# Patient Record
Sex: Female | Born: 2000 | Hispanic: Yes | Marital: Single | Smoking: Never smoker
Health system: Southern US, Community
[De-identification: ages and names within clinical notes are randomized; demographics above are authoritative.]

## PROBLEM LIST (undated history)

## (undated) ENCOUNTER — Inpatient Hospital Stay: Payer: Self-pay

## (undated) DIAGNOSIS — D649 Anemia, unspecified: Secondary | ICD-10-CM

## (undated) HISTORY — PX: NO PAST SURGERIES: SHX2092

---
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
## 2019-04-23 ENCOUNTER — Other Ambulatory Visit: Payer: Self-pay

## 2019-04-23 ENCOUNTER — Encounter: Payer: Self-pay | Admitting: Emergency Medicine

## 2019-04-23 DIAGNOSIS — R109 Unspecified abdominal pain: Secondary | ICD-10-CM | POA: Diagnosis not present

## 2019-04-23 DIAGNOSIS — Z5321 Procedure and treatment not carried out due to patient leaving prior to being seen by health care provider: Secondary | ICD-10-CM | POA: Insufficient documentation

## 2019-04-23 LAB — URINALYSIS, COMPLETE (UACMP) WITH MICROSCOPIC
Bacteria, UA: NONE SEEN
Bilirubin Urine: NEGATIVE
Glucose, UA: NEGATIVE mg/dL
Hgb urine dipstick: NEGATIVE
Ketones, ur: NEGATIVE mg/dL
Nitrite: NEGATIVE
Protein, ur: NEGATIVE mg/dL
Specific Gravity, Urine: 1.005 (ref 1.005–1.030)
pH: 8 (ref 5.0–8.0)

## 2019-04-23 LAB — COMPREHENSIVE METABOLIC PANEL
ALT: 15 U/L (ref 0–44)
AST: 14 U/L — ABNORMAL LOW (ref 15–41)
Albumin: 4.6 g/dL (ref 3.5–5.0)
Alkaline Phosphatase: 51 U/L (ref 38–126)
Anion gap: 8 (ref 5–15)
BUN: 7 mg/dL (ref 6–20)
CO2: 24 mmol/L (ref 22–32)
Calcium: 9.4 mg/dL (ref 8.9–10.3)
Chloride: 107 mmol/L (ref 98–111)
Creatinine, Ser: 0.63 mg/dL (ref 0.44–1.00)
GFR calc Af Amer: >60 mL/min (ref >60)
GFR calc non Af Amer: >60 mL/min (ref >60)
Glucose, Bld: 89 mg/dL (ref 70–99)
Potassium: 3.9 mmol/L (ref 3.5–5.1)
Sodium: 139 mmol/L (ref 135–145)
Total Bilirubin: 0.6 mg/dL (ref 0.3–1.2)
Total Protein: 8 g/dL (ref 6.5–8.1)

## 2019-04-23 LAB — CBC WITH DIFFERENTIAL/PLATELET
Abs Immature Granulocytes: 0.03 10*3/uL (ref 0.00–0.07)
Basophils Absolute: 0 10*3/uL (ref 0.0–0.1)
Basophils Relative: 0 %
Eosinophils Absolute: 0.1 10*3/uL (ref 0.0–0.5)
Eosinophils Relative: 1 %
HCT: 40.9 % (ref 36.0–46.0)
Hemoglobin: 13.2 g/dL (ref 12.0–15.0)
Immature Granulocytes: 0 %
Lymphocytes Relative: 39 %
Lymphs Abs: 3.4 10*3/uL (ref 0.7–4.0)
MCH: 28 pg (ref 26.0–34.0)
MCHC: 32.3 g/dL (ref 30.0–36.0)
MCV: 86.8 fL (ref 80.0–100.0)
Monocytes Absolute: 0.5 10*3/uL (ref 0.1–1.0)
Monocytes Relative: 6 %
Neutro Abs: 4.7 10*3/uL (ref 1.7–7.7)
Neutrophils Relative %: 54 %
Platelets: 243 10*3/uL (ref 150–400)
RBC: 4.71 MIL/uL (ref 3.87–5.11)
RDW: 13.6 % (ref 11.5–15.5)
WBC: 8.7 10*3/uL (ref 4.0–10.5)
nRBC: 0 % (ref 0.0–0.2)

## 2019-04-23 LAB — POCT PREGNANCY, URINE: Preg Test, Ur: NEGATIVE

## 2019-04-23 LAB — LIPASE, BLOOD: Lipase: 34 U/L (ref 11–51)

## 2019-04-23 NOTE — ED Triage Notes (Signed)
Patient ambulatory to triage with steady gait, without difficulty or distress noted, mask in place; pt reports lower abd pain x hr accomp by nausea; st seen for same 40mos ago and dx with ovarian cyst but did not f/u with anyone

## 2019-04-24 ENCOUNTER — Emergency Department
Admission: EM | Admit: 2019-04-24 | Discharge: 2019-04-24 | Disposition: A | Discharge status: Home / Self Care | Payer: Medicaid Other | Attending: Emergency Medicine | Admitting: Emergency Medicine

## 2019-04-26 LAB — GC/CHLAMYDIA PROBE AMP
Chlamydia trachomatis, NAA: NEGATIVE
Neisseria Gonorrhoeae by PCR: NEGATIVE

## 2019-05-27 ENCOUNTER — Encounter: Payer: Self-pay | Admitting: *Deleted

## 2019-05-27 ENCOUNTER — Emergency Department: Payer: Medicaid Other

## 2019-05-27 ENCOUNTER — Emergency Department
Admission: EM | Admit: 2019-05-27 | Discharge: 2019-05-27 | Disposition: A | Discharge status: Home / Self Care | Payer: Medicaid Other | Attending: Emergency Medicine | Admitting: Emergency Medicine

## 2019-05-27 ENCOUNTER — Other Ambulatory Visit: Payer: Self-pay

## 2019-05-27 DIAGNOSIS — R1032 Left lower quadrant pain: Secondary | ICD-10-CM

## 2019-05-27 DIAGNOSIS — B9689 Other specified bacterial agents as the cause of diseases classified elsewhere: Secondary | ICD-10-CM | POA: Insufficient documentation

## 2019-05-27 DIAGNOSIS — R110 Nausea: Secondary | ICD-10-CM | POA: Diagnosis not present

## 2019-05-27 DIAGNOSIS — N760 Acute vaginitis: Secondary | ICD-10-CM | POA: Insufficient documentation

## 2019-05-27 LAB — COMPREHENSIVE METABOLIC PANEL
ALT: 17 U/L (ref 0–44)
AST: 16 U/L (ref 15–41)
Albumin: 4.3 g/dL (ref 3.5–5.0)
Alkaline Phosphatase: 51 U/L (ref 38–126)
Anion gap: 9 (ref 5–15)
BUN: 10 mg/dL (ref 6–20)
CO2: 23 mmol/L (ref 22–32)
Calcium: 9.1 mg/dL (ref 8.9–10.3)
Chloride: 108 mmol/L (ref 98–111)
Creatinine, Ser: 0.49 mg/dL (ref 0.44–1.00)
GFR calc Af Amer: >60 mL/min (ref >60)
GFR calc non Af Amer: >60 mL/min (ref >60)
Glucose, Bld: 95 mg/dL (ref 70–99)
Potassium: 4.1 mmol/L (ref 3.5–5.1)
Sodium: 140 mmol/L (ref 135–145)
Total Bilirubin: 0.5 mg/dL (ref 0.3–1.2)
Total Protein: 7.2 g/dL (ref 6.5–8.1)

## 2019-05-27 LAB — URINALYSIS, COMPLETE (UACMP) WITH MICROSCOPIC
Bilirubin Urine: NEGATIVE
Glucose, UA: NEGATIVE mg/dL
Hgb urine dipstick: NEGATIVE
Ketones, ur: NEGATIVE mg/dL
Leukocytes,Ua: NEGATIVE
Nitrite: NEGATIVE
Protein, ur: NEGATIVE mg/dL
Specific Gravity, Urine: 1.017 (ref 1.005–1.030)
pH: 7 (ref 5.0–8.0)

## 2019-05-27 LAB — CBC
HCT: 40.2 % (ref 36.0–46.0)
Hemoglobin: 12.7 g/dL (ref 12.0–15.0)
MCH: 28 pg (ref 26.0–34.0)
MCHC: 31.6 g/dL (ref 30.0–36.0)
MCV: 88.7 fL (ref 80.0–100.0)
Platelets: 226 10*3/uL (ref 150–400)
RBC: 4.53 MIL/uL (ref 3.87–5.11)
RDW: 13.4 % (ref 11.5–15.5)
WBC: 8.2 10*3/uL (ref 4.0–10.5)
nRBC: 0 % (ref 0.0–0.2)

## 2019-05-27 LAB — POCT PREGNANCY, URINE: Preg Test, Ur: NEGATIVE

## 2019-05-27 LAB — WET PREP, GENITAL
Sperm: NONE SEEN
Trich, Wet Prep: NONE SEEN
Yeast Wet Prep HPF POC: NONE SEEN

## 2019-05-27 LAB — LIPASE, BLOOD: Lipase: 26 U/L (ref 11–51)

## 2019-05-27 MED ORDER — METRONIDAZOLE 500 MG PO TABS: 500.0000 mg | ORAL_TABLET | Freq: Two times a day (BID) | ORAL | 0 refills | Status: AC

## 2019-05-27 NOTE — ED Provider Notes (Signed)
[facility] Emergency Department Provider Note  ____________________________________________   First MD Initiated Contact with Patient 05/27/19 1732     (approximate)  I have reviewed the triage vital signs and the nursing notes.   HISTORY  Chief Complaint Abdominal Pain    HPI Susan Hoover is a 19 y.o. female with prior history of concern for ruptured hemorrhagic cyst who now comes in with left lower quadrant pain.  Patient states that she is been having pain in the left lower quadrant for the past 5 days.  The pain is intermittent, moderate, associate with some nausea.  Last episode was around 2:30 PM.  Currently her pain is very minimal to none.  Has had 1 prior pregnancy. No fevers.           Past Medical History:  Diagnosis Date  . Anemia     Patient Active Problem List   Diagnosis Date Noted  . Post-term pregnancy, 40-42 weeks of gestation 12/29/2017  . Pyelonephritis affecting pregnancy in second trimester 08/09/2017  . Abdominal pain 08/07/2017    History reviewed. No pertinent surgical history.  Prior to Admission medications   Medication Sig Start Date End Date Taking? Authorizing Provider  pantoprazole (PROTONIX) 40 MG tablet Take 1 tablet (40 mg total) by mouth daily. 04/28/18 06/27/18  Harvest Dark, MD    Allergies Patient has no known allergies.  No family history on file.  Social History Social History   Tobacco Use  . Smoking status: Never Smoker  . Smokeless tobacco: Never Used  Substance Use Topics  . Alcohol use: No  . Drug use: No      Review of Systems Constitutional: No fever/chills Eyes: No visual changes. ENT: No sore throat. Cardiovascular: Denies chest pain. Respiratory: Denies shortness of breath. Gastrointestinal: Positive abdominal pain with nausea, no vomiting.  No diarrhea.  No constipation. Genitourinary: Negative for dysuria. Musculoskeletal: Negative for back  pain. Skin: Negative for rash. Neurological: Negative for headaches, focal weakness or numbness. All other ROS negative ____________________________________________   PHYSICAL EXAM:  VITAL SIGNS: ED Triage Vitals  Enc Vitals Group     BP 05/27/19 1644 111/67     Pulse Rate 05/27/19 1644 66     Resp 05/27/19 1644 18     Temp 05/27/19 1644 98.2 F (36.8 C)     Temp Source 05/27/19 1644 Oral     SpO2 05/27/19 1644 100 %     Weight 05/27/19 1645 126 lb (57.2 kg)     Height 05/27/19 1645 5\' 2"  (1.575 m)     Head Circumference --      Peak Flow --      Pain Score 05/27/19 1647 9     Pain Loc --      Pain Edu? --      Excl. in [place]? --     Constitutional: Alert and oriented. Well appearing and in no acute distress. Eyes: Conjunctivae are normal. EOMI. Head: Atraumatic. Nose: No congestion/rhinnorhea. Mouth/Throat: Mucous membranes are moist.   Neck: No stridor. Trachea Midline. FROM Cardiovascular: Normal rate, regular rhythm. Grossly normal heart sounds.  Good peripheral circulation. Respiratory: Normal respiratory effort.  No retractions. Lungs CTAB. Gastrointestinal: Soft and nontender. No distention. No abdominal bruits.  Musculoskeletal: No lower extremity tenderness nor edema.  No joint effusions. Neurologic:  Normal speech and language. No gross focal neurologic deficits are appreciated.  Skin:  Skin is warm, dry and intact. No rash noted. Psychiatric: Mood and affect are  normal. Speech and behavior are normal. GU: No cervical motion tenderness.  No adnexal tenderness, some mild amount of discharge  ____________________________________________   LABS (all labs ordered are listed, but only abnormal results are displayed)  Labs Reviewed  WET PREP, GENITAL - Abnormal; Notable for the following components:      Result Value   Clue Cells Wet Prep HPF POC PRESENT (*)    WBC, Wet Prep HPF POC MODERATE (*)    All other components within normal limits  URINALYSIS, COMPLETE  (UACMP) WITH MICROSCOPIC - Abnormal; Notable for the following components:   Color, Urine YELLOW (*)    APPearance TURBID (*)    Bacteria, UA RARE (*)    All other components within normal limits  GC/CHLAMYDIA PROBE AMP  LIPASE, BLOOD  COMPREHENSIVE METABOLIC PANEL  CBC  POC URINE PREG, ED  POCT PREGNANCY, URINE   ____________________________________________ RADIOLOGY   Official radiology report(s): US PELVIC COMPLETE W TRANSVAGINAL AND TORSION R/O  Result Date: 05/27/2019 CLINICAL DATA:  Left lower quadrant pain for 1 month EXAM: TRANSABDOMINAL AND TRANSVAGINAL ULTRASOUND OF PELVIS DOPPLER ULTRASOUND OF OVARIES TECHNIQUE: Both transabdominal and transvaginal ultrasound examinations of the pelvis were performed. Transabdominal technique was performed for global imaging of the pelvis including uterus, ovaries, adnexal regions, and pelvic cul-de-sac. It was necessary to proceed with endovaginal exam following the transabdominal exam to visualize the ovaries. Color and duplex Doppler ultrasound was utilized to evaluate blood flow to the ovaries. COMPARISON:  Prior CT and ultrasound from 05/03/2018 FINDINGS: Uterus Measurements: 7.6 x 3.4 x 4.8 cm = volume: 65 mL. No fibroids or other mass visualized. Endometrium Thickness: 7.4 mm.  No focal abnormality visualized. Right ovary Measurements: 6.6 x 3.9 x 5.6 cm. = volume: 76 mL. There is a diffusely hypoechoic lesion within the right ovary with internal echoes measuring 5.6 x 3.8 x 4.8 cm. This has a similar appearance to that noted on prior ultrasound and CT consistent with a hemorrhagic cyst. Left ovary Measurements: 2.7 x 2.0 x 1.8 cm = volume: 5.2 mL. Normal appearance/no adnexal mass. Pulsed Doppler evaluation of both ovaries demonstrates normal low-resistance arterial and venous waveforms. Other findings Mild free fluid is noted within the pelvis. IMPRESSION: Persistent appearing lesion in the right ovary. Again hemorrhagic cyst is a primary  consideration. Possibility of an endometrioma deserves consideration as well given the persistent appearance. Mild free fluid is noted. No other focal abnormality is seen. Electronically Signed   By: Alcide Clever M.D.   On: 05/27/2019 19:40    ____________________________________________   PROCEDURES  Procedure(s) performed (including Critical Care):  Procedures   ____________________________________________   INITIAL IMPRESSION / ASSESSMENT AND PLAN / ED COURSE  Susan Hoover was evaluated in Emergency Department on 05/27/2019 for the symptoms described in the history of present illness. She was evaluated in the context of the global COVID-19 pandemic, which necessitated consideration that the patient might be at risk for infection with the SARS-CoV-2 virus that causes COVID-19. Institutional protocols and algorithms that pertain to the evaluation of patients at risk for COVID-19 are in a state of rapid change based on information released by regulatory bodies including the CDC and federal and state organizations. These policies and algorithms were followed during the patient's care in the ED.    Patient is a well-appearing 19 year old who comes in with left lower quadrant pain.  Given her history of hemorrhagic cyst on the right she want to make sure that there was no issues on  the left side.  Will get transvaginal ultrasound to evaluate for torsion, cyst.  She denies any risk factors for sexually transmitted diseases.  Will do pelvic exam to evaluate for PID.  Will get urine to evaluate for UTI and pregnancy test.  Preg test was negative.  UA negative for UTI.  Hemoglobin is stable.  Patient's ultrasound shows similar lesion on the right ovary that is concerning for hemorrhagic versus endometrioma.  When she was seen a year ago she had a moderate amount of free fluid but now she only has very mild free fluid.  Given this lesion is similar in size and her pain is on the left  side I do not think that this is the cause of her pain.  The left ovary looked completely normal.  We discussed the chance that she could be having intermittent torsion on the left ovary which seems unlikely given there is no evidence of cyst.  Given patient is completely asymptomatic at this time and she is very well-appearing with normal vital signs and normal labs she feels comfortable going home and will return to the ER if she develops symptoms that are worsening or any other concerns.  Otherwise she will follow-up with her OB/GYN at her [facility].   Pelvic exam did have some mild amount of discharge.  We discussed prophylactic treatment for STDs but given she is low risk she will wait for it to come back.  She had no adnexal tenderness or cervical motion tenderness to suggest PID.  Her wet prep was positive for BV.  We will start the patient on Flagyl.  Patient feels comfortable with discharge home and will follow up with her OB         ____________________________________________   FINAL CLINICAL IMPRESSION(S) / ED DIAGNOSES   Final diagnoses:  LLQ pain  Bacterial vaginosis      MEDICATIONS GIVEN DURING THIS VISIT:  Medications - No data to display   ED Discharge Orders         Ordered    metroNIDAZOLE (FLAGYL) 500 MG tablet  2 times daily     05/27/19 2053           Note:  This document was prepared using Dragon voice recognition software and may include unintentional dictation errors.   Concha Se, MD 05/27/19 2053

## 2019-05-27 NOTE — ED Triage Notes (Signed)
Pt ambulatory to triage.  Pt has left lower abd pain.  Pt also has dysuria.  Pt also has nausea.  Pt alert  Speech clear.  hx ovarian cyst.

## 2019-05-27 NOTE — ED Notes (Signed)
See triage note  Presents with some left sided abd pain  States she was seen last year for same   Was told that she had an ovarian cyst  States then pain changes in intensity

## 2019-05-27 NOTE — ED Triage Notes (Signed)
FIRST NURSE NOTE- here for pain r/t known ovarian cyst. NAD

## 2019-05-27 NOTE — Discharge Instructions (Signed)
You should follow-up with your OB doctor for your persistent lesion on the right ovary.  There did not seem to be any issues on the left ovary.  You were positive for bacterial vaginosis so we are starting you on antibiotic.  Do not drink alcohol with this.  Return to the ER for worsening pain, fevers or any other concerns    IMPRESSION: Persistent appearing lesion in the right ovary. Again hemorrhagic cyst is a primary consideration. Possibility of an endometrioma deserves consideration as well given the persistent appearance. Mild free fluid is noted. No other focal abnormality is seen.

## 2019-05-27 NOTE — ED Notes (Signed)
poct pregnancy Negative 

## 2019-05-30 LAB — GC/CHLAMYDIA PROBE AMP
Chlamydia trachomatis, NAA: NEGATIVE
Neisseria Gonorrhoeae by PCR: NEGATIVE

## 2019-11-13 ENCOUNTER — Emergency Department (HOSPITAL_COMMUNITY)
Admission: EM | Admit: 2019-11-13 | Discharge: 2019-11-13 | Disposition: A | Discharge status: Home / Self Care | Payer: Medicaid Other | Attending: Emergency Medicine | Admitting: Emergency Medicine

## 2019-11-13 ENCOUNTER — Other Ambulatory Visit: Payer: Self-pay

## 2019-11-13 ENCOUNTER — Encounter (HOSPITAL_COMMUNITY): Payer: Self-pay | Admitting: Emergency Medicine

## 2019-11-13 ENCOUNTER — Inpatient Hospital Stay (EMERGENCY_DEPARTMENT_HOSPITAL)
Admission: AD | Admit: 2019-11-13 | Discharge: 2019-11-13 | Disposition: A | Discharge status: Home / Self Care | Payer: Medicaid Other | Source: Home / Self Care | Attending: Obstetrics and Gynecology | Admitting: Obstetrics and Gynecology

## 2019-11-13 DIAGNOSIS — M545 Low back pain: Secondary | ICD-10-CM | POA: Diagnosis not present

## 2019-11-13 DIAGNOSIS — N939 Abnormal uterine and vaginal bleeding, unspecified: Secondary | ICD-10-CM | POA: Insufficient documentation

## 2019-11-13 DIAGNOSIS — Z3202 Encounter for pregnancy test, result negative: Secondary | ICD-10-CM | POA: Diagnosis not present

## 2019-11-13 DIAGNOSIS — Z5321 Procedure and treatment not carried out due to patient leaving prior to being seen by health care provider: Secondary | ICD-10-CM | POA: Insufficient documentation

## 2019-11-13 LAB — BASIC METABOLIC PANEL
Anion gap: 7 (ref 5–15)
BUN: 11 mg/dL (ref 6–20)
CO2: 23 mmol/L (ref 22–32)
Calcium: 9.3 mg/dL (ref 8.9–10.3)
Chloride: 111 mmol/L (ref 98–111)
Creatinine, Ser: 0.61 mg/dL (ref 0.44–1.00)
GFR calc Af Amer: >60 mL/min (ref >60)
GFR calc non Af Amer: >60 mL/min (ref >60)
Glucose, Bld: 92 mg/dL (ref 70–99)
Potassium: 3.9 mmol/L (ref 3.5–5.1)
Sodium: 141 mmol/L (ref 135–145)

## 2019-11-13 LAB — URINALYSIS, ROUTINE W REFLEX MICROSCOPIC
Bilirubin Urine: NEGATIVE
Glucose, UA: NEGATIVE mg/dL
Ketones, ur: NEGATIVE mg/dL
Leukocytes,Ua: NEGATIVE
Nitrite: NEGATIVE
Protein, ur: NEGATIVE mg/dL
Specific Gravity, Urine: 1.018 (ref 1.005–1.030)
pH: 8 (ref 5.0–8.0)

## 2019-11-13 LAB — CBC WITH DIFFERENTIAL/PLATELET
Abs Immature Granulocytes: 0.03 10*3/uL (ref 0.00–0.07)
Basophils Absolute: 0 10*3/uL (ref 0.0–0.1)
Basophils Relative: 0 %
Eosinophils Absolute: 0.1 10*3/uL (ref 0.0–0.5)
Eosinophils Relative: 1 %
HCT: 38.9 % (ref 36.0–46.0)
Hemoglobin: 12.4 g/dL (ref 12.0–15.0)
Immature Granulocytes: 0 %
Lymphocytes Relative: 30 %
Lymphs Abs: 2.6 10*3/uL (ref 0.7–4.0)
MCH: 28.1 pg (ref 26.0–34.0)
MCHC: 31.9 g/dL (ref 30.0–36.0)
MCV: 88 fL (ref 80.0–100.0)
Monocytes Absolute: 0.6 10*3/uL (ref 0.1–1.0)
Monocytes Relative: 7 %
Neutro Abs: 5.2 10*3/uL (ref 1.7–7.7)
Neutrophils Relative %: 62 %
Platelets: 232 10*3/uL (ref 150–400)
RBC: 4.42 MIL/uL (ref 3.87–5.11)
RDW: 14.2 % (ref 11.5–15.5)
WBC: 8.4 10*3/uL (ref 4.0–10.5)
nRBC: 0 % (ref 0.0–0.2)

## 2019-11-13 LAB — I-STAT BETA HCG BLOOD, ED (MC, WL, AP ONLY): I-stat hCG, quantitative: <5 m[IU]/mL (ref <5)

## 2019-11-13 NOTE — ED Notes (Signed)
Pt did not respond when called for vitals check X2, not seen in the lobby or outside the emergency entry

## 2019-11-13 NOTE — ED Triage Notes (Signed)
Patient reports low back pain and vaginal bleeding/spotting this afternoon , denies abdominal pain , no fever or chills .

## 2019-11-13 NOTE — MAU Note (Signed)
Did like 4 preg tests 3 wks ago, all came out positive.  Had spotting yesterday, now like a period.  Went to the ER for this, the ER was full - so she left.

## 2019-11-13 NOTE — MAU Provider Note (Signed)
First Provider Initiated Contact with Patient 11/13/19 1659      S Ms. Susan Hoover is a 19 y.o. G1P1001 non-pregnant female who presents to MAU today with complaint of vaginal bleeding like a period and multiple positive home pregnancy tests 3 weeks ago.   O BP 114/71 (BP Location: Right Arm)   Pulse 75   Temp 98.8 F (37.1 C) (Oral)   Resp 16   Wt 58.2 kg   LMP 10/03/2019   SpO2 100%   BMI 22.71 kg/m    Patient Vitals for the past 24 hrs:  BP Temp Temp src Pulse Resp SpO2 Weight  11/13/19 1657 114/71 98.8 F (37.1 C) Oral 75 16 100 % 58.2 kg   Physical Exam  Constitutional:  Non-toxic appearance. She does not appear ill. No distress.  HENT:  Head: Normocephalic and atraumatic.  Respiratory: Effort normal.  GI: Normal appearance.  Neurological: She is alert.  Skin: She is not diaphoretic.  Psychiatric: Her behavior is normal. Mood, judgment and thought content normal.   A Non pregnant female Medical screening exam complete Patient was seen in the ED and had hCG drawn at 131AM this morning showing <5  P Discharge from MAU in stable condition Patient given the option of transfer to [facility] for further evaluation or seek care in outpatient facility of choice List of options for follow-up given  Warning signs for worsening condition that would warrant emergency follow-up discussed Patient may return to MAU as needed for pregnancy related complaints  Deosha Werden, Odie Sera, NP 11/13/2019 5:21 PM

## 2020-03-23 ENCOUNTER — Encounter (HOSPITAL_COMMUNITY): Payer: Self-pay | Admitting: Obstetrics & Gynecology

## 2020-03-23 ENCOUNTER — Inpatient Hospital Stay (HOSPITAL_COMMUNITY)
Admission: AD | Admit: 2020-03-23 | Discharge: 2020-03-24 | Disposition: A | Discharge status: Home / Self Care | Payer: Medicaid Other | Attending: Obstetrics & Gynecology | Admitting: Obstetrics & Gynecology

## 2020-03-23 ENCOUNTER — Other Ambulatory Visit: Payer: Self-pay

## 2020-03-23 DIAGNOSIS — O3680X0 Pregnancy with inconclusive fetal viability, not applicable or unspecified: Secondary | ICD-10-CM

## 2020-03-23 DIAGNOSIS — R109 Unspecified abdominal pain: Secondary | ICD-10-CM | POA: Insufficient documentation

## 2020-03-23 DIAGNOSIS — R1030 Lower abdominal pain, unspecified: Secondary | ICD-10-CM | POA: Diagnosis not present

## 2020-03-23 DIAGNOSIS — Z3A01 Less than 8 weeks gestation of pregnancy: Secondary | ICD-10-CM | POA: Insufficient documentation

## 2020-03-23 DIAGNOSIS — N838 Other noninflammatory disorders of ovary, fallopian tube and broad ligament: Secondary | ICD-10-CM | POA: Diagnosis not present

## 2020-03-23 DIAGNOSIS — O26899 Other specified pregnancy related conditions, unspecified trimester: Secondary | ICD-10-CM

## 2020-03-23 DIAGNOSIS — O26891 Other specified pregnancy related conditions, first trimester: Secondary | ICD-10-CM | POA: Insufficient documentation

## 2020-03-23 LAB — CBC
HCT: 38.1 % (ref 36.0–46.0)
Hemoglobin: 12.3 g/dL (ref 12.0–15.0)
MCH: 28 pg (ref 26.0–34.0)
MCHC: 32.3 g/dL (ref 30.0–36.0)
MCV: 86.6 fL (ref 80.0–100.0)
Platelets: 222 10*3/uL (ref 150–400)
RBC: 4.4 MIL/uL (ref 3.87–5.11)
RDW: 13.7 % (ref 11.5–15.5)
WBC: 8.5 10*3/uL (ref 4.0–10.5)
nRBC: 0 % (ref 0.0–0.2)

## 2020-03-23 NOTE — MAU Provider Note (Signed)
History     CSN: [id number]  Arrival date and time: 03/23/20 2219   First Provider Initiated Contact with Patient 03/23/20 2323      Chief Complaint  Patient presents with  . Abdominal Cramping   HPI  [facility] Aide Wojnar is a 19 y.o. G2P1001 in early pregnancy who presents to MAU with chief complaint of abdominal cramping. This is a new problem, onset three days ago. Patient's pain is bilateral, lower abdomen, and radiates laterally to her lower back. She rates her pain as 8/10 at its worst. She denies aggravating or alleviating factors. She has not taken medication or tried other treatments for this complaint.  She denies vaginal bleeding, abdominal tenderness, dysuria, fever or recent illness. Endorses sexual intercourse daily but does not find it to be an aggravating factor.    OB History    Gravida  2   Para  1   Term  1   Preterm      AB      Living  1     SAB      TAB      Ectopic      Multiple  0   Live Births  1           Past Medical History:  Diagnosis Date  . Anemia     No past surgical history on file.  No family history on file.  Social History   Tobacco Use  . Smoking status: Never Smoker  . Smokeless tobacco: Never Used  Substance Use Topics  . Alcohol use: No  . Drug use: No    Allergies: No Known Allergies  Medications Prior to Admission  Medication Sig Dispense Refill Last Dose  . pantoprazole (PROTONIX) 40 MG tablet Take 1 tablet (40 mg total) by mouth daily. 30 tablet 1     Review of Systems  Gastrointestinal: Positive for abdominal pain.  All other systems reviewed and are negative.  Physical Exam   Last menstrual period 02/15/2020.  Physical Exam Vitals and nursing note reviewed. Exam conducted with a chaperone present.  Constitutional:      General: She is not in acute distress.    Appearance: She is not ill-appearing.  HENT:     Mouth/Throat:     Mouth: Mucous membranes are moist.   Cardiovascular:     Rate and Rhythm: Normal rate.     Pulses: Normal pulses.     Heart sounds: Normal heart sounds.  Pulmonary:     Effort: Pulmonary effort is normal.     Breath sounds: Normal breath sounds.  Abdominal:     General: Abdomen is flat.     Tenderness: There is no abdominal tenderness. There is no right CVA tenderness, left CVA tenderness or guarding.  Skin:    General: Skin is warm and dry.     Capillary Refill: Capillary refill takes less than 2 seconds.  Neurological:     General: No focal deficit present.     Mental Status: She is alert.  Psychiatric:        Mood and Affect: Mood normal.        Thought Content: Thought content normal.        Judgment: Judgment normal.     MAU Course  Procedures  Patient Vitals for the past 24 hrs:  BP Temp Temp src Pulse Resp SpO2  03/24/20 0105 124/64 98.2 F (36.8 C) Oral 82 16 96 %  03/23/20 2305 (!) 88/65 98 F (  36.7 C) Oral 92 16 97 %   Results for orders placed or performed during the hospital encounter of 03/23/20 (from the past 24 hour(s))  Wet prep, genital     Status: Abnormal   Collection Time: 03/23/20 11:17 PM   Specimen: PATH Cytology Cervicovaginal Ancillary Only  Result Value Ref Range   Yeast Wet Prep HPF POC NONE SEEN NONE SEEN   Trich, Wet Prep NONE SEEN NONE SEEN   Clue Cells Wet Prep HPF POC NONE SEEN NONE SEEN   WBC, Wet Prep HPF POC MODERATE (A) NONE SEEN   Sperm NONE SEEN   CBC     Status: None   Collection Time: 03/23/20 11:28 PM  Result Value Ref Range   WBC 8.5 4.0 - 10.5 K/uL   RBC 4.40 3.87 - 5.11 MIL/uL   Hemoglobin 12.3 12.0 - 15.0 g/dL   HCT 16.0 36 - 46 %   MCV 86.6 80.0 - 100.0 fL   MCH 28.0 26.0 - 34.0 pg   MCHC 32.3 30.0 - 36.0 g/dL   RDW 10.9 32.3 - 55.7 %   Platelets 222 150 - 400 K/uL   nRBC 0.0 0.0 - 0.2 %  hCG, quantitative, pregnancy     Status: Abnormal   Collection Time: 03/23/20 11:28 PM  Result Value Ref Range   hCG, Beta Chain, Quant, S 3,360 (H) <5 mIU/mL    US OB LESS THAN 14 WEEKS WITH OB TRANSVAGINAL  Result Date: 03/24/2020 CLINICAL DATA:  Cramping EXAM: OBSTETRIC <14 WK [place] AND TRANSVAGINAL OB US TECHNIQUE: Both transabdominal and transvaginal ultrasound examinations were performed for complete evaluation of the gestation as well as the maternal uterus, adnexal regions, and pelvic cul-de-sac. Transvaginal technique was performed to assess early pregnancy. COMPARISON:  Ultrasound 05/27/2019 FINDINGS: Intrauterine gestational sac: Single Yolk sac:  Not Visualized. Embryo:  Not Visualized. Cardiac Activity: Not Visualized. Heart Rate:   bpm MSD: 3.4 mm   5 w   0 d CRL:    mm    w    d                  [place] EDC: Subchorionic hemorrhage:  None visualized. Maternal uterus/adnexae: No free fluid. 4.9 x 4.8 x 3.7 cm homogeneous right ovarian mass again noted most compatible with endometrioma. This previously measured up to 5.6 cm. IMPRESSION: Early intrauterine gestation, 5 weeks by mean sac diameter. No yolk sac or fetal pole currently. This could be followed with repeat ultrasound in 14 days to ensure expected progression. Stable homogeneous right ovarian mass, favor endometrioma. Electronically Signed   By: Charlett Nose M.D.   On: 03/24/2020 00:46   Assessment and Plan  --19 y.o. G2P1001 with pregnancy of unknown location --+ GS, no YS --Denies pain throughout evaluation in MAU --Discharge home in stable condition with ectopic precautions  F/U: --Stat Quant hCG Friday morning 11/05 at [facility]  Calvert Cantor, CNM 03/24/2020, 1:52 AM

## 2020-03-23 NOTE — MAU Note (Signed)
Patient presents with c/o cramping x 3 days, LMP 9/26, + HPT.

## 2020-03-24 ENCOUNTER — Inpatient Hospital Stay (HOSPITAL_COMMUNITY): Payer: Medicaid Other

## 2020-03-24 DIAGNOSIS — O26891 Other specified pregnancy related conditions, first trimester: Secondary | ICD-10-CM | POA: Diagnosis not present

## 2020-03-24 DIAGNOSIS — O3680X0 Pregnancy with inconclusive fetal viability, not applicable or unspecified: Secondary | ICD-10-CM

## 2020-03-24 DIAGNOSIS — R1030 Lower abdominal pain, unspecified: Secondary | ICD-10-CM | POA: Diagnosis not present

## 2020-03-24 DIAGNOSIS — Z3A01 Less than 8 weeks gestation of pregnancy: Secondary | ICD-10-CM

## 2020-03-24 DIAGNOSIS — R109 Unspecified abdominal pain: Secondary | ICD-10-CM | POA: Diagnosis not present

## 2020-03-24 DIAGNOSIS — N838 Other noninflammatory disorders of ovary, fallopian tube and broad ligament: Secondary | ICD-10-CM | POA: Diagnosis not present

## 2020-03-24 LAB — GC/CHLAMYDIA PROBE AMP (~~LOC~~) NOT AT ARMC
Chlamydia: NEGATIVE
Comment: NEGATIVE
Comment: NORMAL
Neisseria Gonorrhea: NEGATIVE

## 2020-03-24 LAB — HCG, QUANTITATIVE, PREGNANCY: hCG, Beta Chain, Quant, S: 3360 m[IU]/mL — ABNORMAL HIGH (ref <5)

## 2020-03-24 LAB — WET PREP, GENITAL
Clue Cells Wet Prep HPF POC: NONE SEEN
Sperm: NONE SEEN
Trich, Wet Prep: NONE SEEN
Yeast Wet Prep HPF POC: NONE SEEN

## 2020-03-24 NOTE — Discharge Instructions (Signed)
First Trimester of Pregnancy  The first trimester of pregnancy is from week 1 until the end of week 13 (months 1 through 3). During this time, your baby will begin to develop inside you. At 6-8 weeks, the eyes and face are formed, and the heartbeat can be seen on ultrasound. At the end of 12 weeks, all the baby's organs are formed. Prenatal care is all the medical care you receive before the birth of your baby. Make sure you get good prenatal care and follow all of your doctor's instructions. Follow these instructions at home: Medicines  Take over-the-counter and prescription medicines only as told by your doctor. Some medicines are safe and some medicines are not safe during pregnancy.  Take a prenatal vitamin that contains at least 600 micrograms (mcg) of folic acid.  If you have trouble pooping (constipation), take medicine that will make your stool soft (stool softener) if your doctor approves. Eating and drinking   Eat regular, healthy meals.  Your doctor will tell you the amount of weight gain that is right for you.  Avoid raw meat and uncooked cheese.  If you feel sick to your stomach (nauseous) or throw up (vomit): ? Eat 4 or 5 small meals a day instead of 3 large meals. ? Try eating a few soda crackers. ? Drink liquids between meals instead of during meals.  To prevent constipation: ? Eat foods that are high in fiber, like fresh fruits and vegetables, whole grains, and beans. ? Drink enough fluids to keep your pee (urine) clear or pale yellow. Activity  Exercise only as told by your doctor. Stop exercising if you have cramps or pain in your lower belly (abdomen) or low back.  Do not exercise if it is too hot, too humid, or if you are in a place of great height (high altitude).  Try to avoid standing for long periods of time. Move your legs often if you must stand in one place for a long time.  Avoid heavy lifting.  Wear low-heeled shoes. Sit and stand up  straight.  You can have sex unless your doctor tells you not to. Relieving pain and discomfort  Wear a good support bra if your breasts are sore.  Take warm water baths (sitz baths) to soothe pain or discomfort caused by hemorrhoids. Use hemorrhoid cream if your doctor says it is okay.  Rest with your legs raised if you have leg cramps or low back pain.  If you have puffy, bulging veins (varicose veins) in your legs: ? Wear support hose or compression stockings as told by your doctor. ? Raise (elevate) your feet for 15 minutes, 3-4 times a day. ? Limit salt in your food. Prenatal care  Schedule your prenatal visits by the twelfth week of pregnancy.  Write down your questions. Take them to your prenatal visits.  Keep all your prenatal visits as told by your doctor. This is important. Safety  Wear your seat belt at all times when driving.  Make a list of emergency phone numbers. The list should include numbers for family, friends, the hospital, and police and fire departments. General instructions  Ask your doctor for a referral to a local prenatal class. Begin classes no later than at the start of month 6 of your pregnancy.  Ask for help if you need counseling or if you need help with nutrition. Your doctor can give you advice or tell you where to go for help.  Do not use hot tubs, steam   rooms, or saunas.  Do not douche or use tampons or scented sanitary pads.  Do not cross your legs for long periods of time.  Avoid all herbs and alcohol. Avoid drugs that are not approved by your doctor.  Do not use any tobacco products, including cigarettes, chewing tobacco, and electronic cigarettes. If you need help quitting, ask your doctor. You may get counseling or other support to help you quit.  Avoid cat litter boxes and soil used by cats. These carry germs that can cause birth defects in the baby and can cause a loss of your baby (miscarriage) or stillbirth.  Visit your dentist.  At home, brush your teeth with a soft toothbrush. Be gentle when you floss. Contact a doctor if:  You are dizzy.  You have mild cramps or pressure in your lower belly.  You have a nagging pain in your belly area.  You continue to feel sick to your stomach, you throw up, or you have watery poop (diarrhea).  You have a bad smelling fluid coming from your vagina.  You have pain when you pee (urinate).  You have increased puffiness (swelling) in your face, hands, legs, or ankles. Get help right away if:  You have a fever.  You are leaking fluid from your vagina.  You have spotting or bleeding from your vagina.  You have very bad belly cramping or pain.  You gain or lose weight rapidly.  You throw up blood. It may look like coffee grounds.  You are around people who have German measles, fifth disease, or chickenpox.  You have a very bad headache.  You have shortness of breath.  You have any kind of trauma, such as from a fall or a car accident. Summary  The first trimester of pregnancy is from week 1 until the end of week 13 (months 1 through 3).  To take care of yourself and your unborn baby, you will need to eat healthy meals, take medicines only if your doctor tells you to do so, and do activities that are safe for you and your baby.  Keep all follow-up visits as told by your doctor. This is important as your doctor will have to ensure that your baby is healthy and growing well. This information is not intended to replace advice given to you by your health care provider. Make sure you discuss any questions you have with your health care provider. Document Revised: 08/29/2018 Document Reviewed: 05/16/2016 Elsevier Patient Education  2020 [employer]  Safe Medications in Pregnancy   Acne: Benzoyl Peroxide Salicylic Acid  Backache/Headache: Tylenol: 2 regular strength every 4 hours OR              2 Extra strength every 6  hours  Colds/Coughs/Allergies: Benadryl (alcohol free) 25 mg every 6 hours as needed Breath right strips Claritin Cepacol throat lozenges Chloraseptic throat spray Cold-Eeze- up to three times per day Cough drops, alcohol free Flonase (by prescription only) Guaifenesin Mucinex Robitussin DM (plain only, alcohol free) Saline nasal spray/drops Sudafed (pseudoephedrine) & Actifed ** use only after [redacted] weeks gestation and if you do not have high blood pressure Tylenol Vicks Vaporub Zinc lozenges Zyrtec   Constipation: Colace Ducolax suppositories Fleet enema Glycerin suppositories Metamucil Milk of magnesia Miralax Senokot Smooth move tea  Diarrhea: Kaopectate Imodium A-D  *NO pepto Bismol  Hemorrhoids: Anusol Anusol HC Preparation H Tucks  Indigestion: Tums Maalox Mylanta Zantac  Pepcid  Insomnia: Benadryl (alcohol free) 25mg every 6 hours as needed   Tylenol PM Unisom, no Gelcaps  Leg Cramps: Tums MagGel  Nausea/Vomiting:  Bonine Dramamine Emetrol Ginger extract Sea bands Meclizine  Nausea medication to take during pregnancy:  Unisom (doxylamine succinate 25 mg tablets) Take one tablet daily at bedtime. If symptoms are not adequately controlled, the dose can be increased to a maximum recommended dose of two tablets daily (1/2 tablet in the morning, 1/2 tablet mid-afternoon and one at bedtime). Vitamin B6 100mg tablets. Take one tablet twice a day (up to 200 mg per day).  Skin Rashes: Aveeno products Benadryl cream or 25mg every 6 hours as needed Calamine Lotion 1% cortisone cream  Yeast infection: Gyne-lotrimin 7 Monistat 7   **If taking multiple medications, please check labels to avoid duplicating the same active ingredients **take medication as directed on the label ** Do not exceed 4000 mg of tylenol in 24 hours **Do not take medications that contain aspirin or ibuprofen   

## 2020-03-26 ENCOUNTER — Telehealth: Payer: Self-pay

## 2020-03-26 ENCOUNTER — Other Ambulatory Visit: Payer: Self-pay

## 2020-03-26 ENCOUNTER — Ambulatory Visit (INDEPENDENT_AMBULATORY_CARE_PROVIDER_SITE_OTHER): Payer: Medicaid Other | Admitting: General Practice

## 2020-03-26 DIAGNOSIS — O3680X0 Pregnancy with inconclusive fetal viability, not applicable or unspecified: Secondary | ICD-10-CM | POA: Diagnosis not present

## 2020-03-26 LAB — BETA HCG QUANT (REF LAB): hCG Quant: 5707 m[IU]/mL

## 2020-03-26 NOTE — Progress Notes (Signed)
Patient presents to office today for stat bhcg following up from MAU visit on 11/2. Patient reports cramping has stopped since visit and denies bleeding. Discussed with patient we are monitoring your bhcg levels today. Results take approximately 2 hours to finalize and will be reviewed with a provider in our office, after which we will call you with results. Patient verbalized understanding and provided callback number [phone].  Reviewed results with Dr Vergie Living who finds appropriate rise in bhcg levels. Dr Vergie Living orders repeat ultrasound in 10-14 days. Scheduled for 11/18 at 8am.  Called patient, no answer- left message to call us back for results. Will try again later.  Chase Caller RN BSN 03/26/20

## 2020-03-26 NOTE — Telephone Encounter (Signed)
Patient needs to be called with results from stat beta HCG on 03/26/20; pt did not answer RN phone call.

## 2020-03-29 NOTE — Telephone Encounter (Signed)
Called pt and informed her of BHCG results from 11/5 showing appropriate rise. For this reason a follow up ultrasound has been scheduled in order to check the progression of the pregnancy. Pt advised of appt on 11/18 @ 0800. She will be notified of the ultrasound results via phone and/or MyChart - usually within 24 hours. Pt voiced understanding and agreed to plan of care.

## 2020-03-29 NOTE — Progress Notes (Signed)
Patient was assessed and managed by nursing staff during this encounter. I have reviewed the chart and agree with the documentation and plan. I have also made any necessary editorial changes.  Paonia Bing, MD 03/29/2020 4:08 PM

## 2020-04-05 ENCOUNTER — Encounter (HOSPITAL_COMMUNITY): Payer: Self-pay | Admitting: Obstetrics and Gynecology

## 2020-04-05 ENCOUNTER — Other Ambulatory Visit: Payer: Self-pay

## 2020-04-05 ENCOUNTER — Inpatient Hospital Stay (HOSPITAL_COMMUNITY)
Admission: AD | Admit: 2020-04-05 | Discharge: 2020-04-05 | Disposition: A | Discharge status: Home / Self Care | Payer: Medicaid Other | Attending: Obstetrics and Gynecology | Admitting: Obstetrics and Gynecology

## 2020-04-05 ENCOUNTER — Inpatient Hospital Stay (HOSPITAL_COMMUNITY): Payer: Medicaid Other

## 2020-04-05 DIAGNOSIS — R109 Unspecified abdominal pain: Secondary | ICD-10-CM

## 2020-04-05 DIAGNOSIS — M549 Dorsalgia, unspecified: Secondary | ICD-10-CM | POA: Diagnosis not present

## 2020-04-05 DIAGNOSIS — Z3A01 Less than 8 weeks gestation of pregnancy: Secondary | ICD-10-CM | POA: Diagnosis not present

## 2020-04-05 DIAGNOSIS — R1013 Epigastric pain: Secondary | ICD-10-CM | POA: Diagnosis not present

## 2020-04-05 DIAGNOSIS — O26891 Other specified pregnancy related conditions, first trimester: Secondary | ICD-10-CM | POA: Diagnosis not present

## 2020-04-05 LAB — URINALYSIS, ROUTINE W REFLEX MICROSCOPIC
Bilirubin Urine: NEGATIVE
Glucose, UA: NEGATIVE mg/dL
Hgb urine dipstick: NEGATIVE
Ketones, ur: NEGATIVE mg/dL
Leukocytes,Ua: NEGATIVE
Nitrite: NEGATIVE
Protein, ur: NEGATIVE mg/dL
Specific Gravity, Urine: 1.009 (ref 1.005–1.030)
pH: 7 (ref 5.0–8.0)

## 2020-04-05 LAB — WET PREP, GENITAL
Clue Cells Wet Prep HPF POC: NONE SEEN
Sperm: NONE SEEN
Trich, Wet Prep: NONE SEEN
Yeast Wet Prep HPF POC: NONE SEEN

## 2020-04-05 NOTE — MAU Provider Note (Signed)
Patient Susan Hoover Susan Hoover is a 19 y.o. G2P1001  at [redacted]w[redacted]d here with complaints of abdominal pain, back pain. She denies vaginal discharge, vaginal spotting, dysuria, NV, fever, SOB, or other pain.  She has had intercourse in the past 24 hours and the pain started after that.   Patient was seen in MAU on 11/2 and had complete ectopic work-up done; follow up quant rose appropriately. She was scheduled to have a follow up US for viability on 11/18.  History     CSN: [id number]  Arrival date and time: 04/05/20 2015   None     Chief Complaint  Patient presents with   Abdominal Pain   Back Pain   Abdominal Pain This is a new problem. The current episode started yesterday. The problem occurs intermittently. The pain is located in the epigastric region. The quality of the pain is cramping. The abdominal pain does not radiate. Pertinent negatives include no constipation, diarrhea, fever, nausea or vomiting. Exacerbated by: lying on her stomach. The pain is relieved by nothing.    OB History     Gravida  2   Para  1   Term  1   Preterm      AB      Living  1      SAB      TAB      Ectopic      Multiple  0   Live Births  1           Past Medical History:  Diagnosis Date   Anemia     History reviewed. No pertinent surgical history.  History reviewed. No pertinent family history.  Social History   Tobacco Use   Smoking status: Never Smoker   Smokeless tobacco: Never Used  Substance Use Topics   Alcohol use: No   Drug use: No    Allergies: No Known Allergies  No medications prior to admission.    Review of Systems  Constitutional: Negative for fever.  Gastrointestinal: Positive for abdominal pain. Negative for constipation, diarrhea, nausea and vomiting.   Physical Exam   Blood pressure (!) 108/58, pulse 71, temperature 98.2 F (36.8 C), resp. rate 18, height 5\' 3"  (1.6 m), weight 63.6 kg, last menstrual period 02/15/2020, SpO2 100  %.  Physical Exam Constitutional:      Appearance: She is well-developed.  HENT:     Head: Normocephalic.  Cardiovascular:     Rate and Rhythm: Normal rate.  Abdominal:     Palpations: Abdomen is soft.     Tenderness: There is abdominal tenderness in the left upper quadrant and left lower quadrant.  Genitourinary:    Vagina: Normal. No signs of injury. No vaginal discharge or tenderness.     Uterus: Normal.   Neurological:     Mental Status: She is alert.     MAU Course  Procedures  MDM -wet prep collected -GC pending -02/17/2020 shows SIUP with cardiac activity. I have independently reviewed the [place] images, which reveal finding of fetal pole with cardiac activity.    Assessment and Plan   1. Epigastric pain   2. Abdominal pain    -patient stable for discharge -reviewed first trimester precautions, reassurance given on normalcy of occasional cramping and discomfort after intercourse -message sent to [place] to cancel appt on 11/18 -GC pending; will be notified if positive.  -all questions answered.  -list of prenatal providers given  12/18 04/05/2020, 10:19 PM

## 2020-04-05 NOTE — Discharge Instructions (Signed)
Prenatal Care Providers           Center for [facility] @ [facility]  [address] [phone]  Center for [facility] @ [facility]   [address]  [phone]  [facility] For [facility] @ [facility]       [address] [phone]            Center for [facility] @ [place]     [phone]          Center for [facility] @ [facility]   [phone]  Center for [facility] @ [facility]  [address] [phone]     Center for [facility] @ [facility] [address] [place])  [address]   [phone]     [facility] Department  Phone: [phone]          First Trimester of Pregnancy  The first trimester of pregnancy is from week 1 until the end of week 13 (months 1 through 3). During this time, your baby will begin to develop inside you. At 6-8 weeks, the eyes and face are formed, and the heartbeat can be seen on ultrasound. At the end of 12 weeks, all the baby's organs are formed. Prenatal care is all the medical care you receive before the birth of your baby. Make sure you get good prenatal care and follow all of your doctor's instructions. Follow these instructions at home: Medicines  Take over-the-counter and prescription medicines only as told by your doctor. Some medicines are safe and some medicines are not safe during pregnancy.  Take a prenatal vitamin that contains at least 600 micrograms (mcg) of folic acid.  If you have trouble pooping (constipation), take medicine that will make your stool soft (stool softener) if your doctor approves. Eating and drinking   Eat regular, healthy meals.  Your doctor will tell you the amount of weight gain that is right for you.  Avoid raw meat and uncooked cheese.  If you feel sick to your stomach (nauseous) or throw up (vomit): ? Eat 4 or 5 small meals a  day instead of 3 large meals. ? Try eating a few soda crackers. ? Drink liquids between meals instead of during meals.  To prevent constipation: ? Eat foods that are high in fiber, like fresh fruits and vegetables, whole grains, and beans. ? Drink enough fluids to keep your pee (urine) clear or pale yellow. Activity  Exercise only as told by your doctor. Stop exercising if you have cramps or pain in your lower belly (abdomen) or low back.  Do not exercise if it is too hot, too humid, or if you are in a place of great height (high altitude).  Try to avoid standing for long periods of time. Move your legs often if you must stand in one place for a long time.  Avoid heavy lifting.  Wear low-heeled shoes. Sit and stand up straight.  You can have sex unless your doctor tells you not to. Relieving pain and discomfort  Wear a good support bra if your breasts are sore.  Take warm water baths (sitz baths) to soothe pain or discomfort caused by hemorrhoids. Use hemorrhoid cream if your doctor says it is okay.  Rest with your legs raised if you have leg cramps or low back pain.  If you have puffy, bulging veins (varicose veins) in your  legs: ? Wear support hose or compression stockings as told by your doctor. ? Raise (elevate) your feet for 15 minutes, 3-4 times a day. ? Limit salt in your food. Prenatal care  Schedule your prenatal visits by the twelfth week of pregnancy.  Write down your questions. Take them to your prenatal visits.  Keep all your prenatal visits as told by your doctor. This is important. Safety  Wear your seat belt at all times when driving.  Make a list of emergency phone numbers. The list should include numbers for family, friends, the hospital, and police and fire departments. General instructions  Ask your doctor for a referral to a local prenatal class. Begin classes no later than at the start of month 6 of your pregnancy.  Ask for help if you need  counseling or if you need help with nutrition. Your doctor can give you advice or tell you where to go for help.  Do not use hot tubs, steam rooms, or saunas.  Do not douche or use tampons or scented sanitary pads.  Do not cross your legs for long periods of time.  Avoid all herbs and alcohol. Avoid drugs that are not approved by your doctor.  Do not use any tobacco products, including cigarettes, chewing tobacco, and electronic cigarettes. If you need help quitting, ask your doctor. You may get counseling or other support to help you quit.  Avoid cat litter boxes and soil used by cats. These carry germs that can cause birth defects in the baby and can cause a loss of your baby (miscarriage) or stillbirth.  Visit your dentist. At home, brush your teeth with a soft toothbrush. Be gentle when you floss. Contact a doctor if:  You are dizzy.  You have mild cramps or pressure in your lower belly.  You have a nagging pain in your belly area.  You continue to feel sick to your stomach, you throw up, or you have watery poop (diarrhea).  You have a bad smelling fluid coming from your vagina.  You have pain when you pee (urinate).  You have increased puffiness (swelling) in your face, hands, legs, or ankles. Get help right away if:  You have a fever.  You are leaking fluid from your vagina.  You have spotting or bleeding from your vagina.  You have very bad belly cramping or pain.  You gain or lose weight rapidly.  You throw up blood. It may look like coffee grounds.  You are around people who have [place] measles, fifth disease, or chickenpox.  You have a very bad headache.  You have shortness of breath.  You have any kind of trauma, such as from a fall or a car accident. Summary  The first trimester of pregnancy is from week 1 until the end of week 13 (months 1 through 3).  To take care of yourself and your unborn baby, you will need to eat healthy meals, take medicines  only if your doctor tells you to do so, and do activities that are safe for you and your baby.  Keep all follow-up visits as told by your doctor. This is important as your doctor will have to ensure that your baby is healthy and growing well. This information is not intended to replace advice given to you by your health care provider. Make sure you discuss any questions you have with your health care provider. Document Revised: 08/29/2018 Document Reviewed: 05/16/2016 Elsevier Patient Education  2020 [employer].

## 2020-04-05 NOTE — MAU Note (Signed)
PT SAYS SHE HAS BEEN CRAMPING SINCE YESTERDAY -LOWER BACK PAIN AND SOB SOMETIMES - NOT NOW. 02 SAT IN TRIAGE -100. LAST SEX- YESTERDAY

## 2020-04-06 LAB — GC/CHLAMYDIA PROBE AMP (~~LOC~~) NOT AT ARMC
Chlamydia: NEGATIVE
Comment: NEGATIVE
Comment: NORMAL
Neisseria Gonorrhea: NEGATIVE

## 2020-04-08 ENCOUNTER — Ambulatory Visit: Admission: RE | Admit: 2020-04-08 | Payer: Medicaid Other | Source: Ambulatory Visit

## 2020-05-06 ENCOUNTER — Other Ambulatory Visit: Payer: Self-pay

## 2020-05-06 ENCOUNTER — Ambulatory Visit (INDEPENDENT_AMBULATORY_CARE_PROVIDER_SITE_OTHER): Payer: Medicaid Other | Admitting: Obstetrics and Gynecology

## 2020-05-06 ENCOUNTER — Encounter: Payer: Self-pay | Admitting: Obstetrics and Gynecology

## 2020-05-06 VITALS — BP 126/78 | HR 111 | Ht 63.0 in | Wt 133.1 lb

## 2020-05-06 DIAGNOSIS — Z7689 Persons encountering health services in other specified circumstances: Secondary | ICD-10-CM | POA: Diagnosis not present

## 2020-05-06 DIAGNOSIS — Z3491 Encounter for supervision of normal pregnancy, unspecified, first trimester: Secondary | ICD-10-CM | POA: Diagnosis not present

## 2020-05-06 DIAGNOSIS — Z3A11 11 weeks gestation of pregnancy: Secondary | ICD-10-CM | POA: Diagnosis not present

## 2020-05-06 NOTE — Progress Notes (Signed)
HPI:      Susan Hoover is a 19 y.o. G2P1001 who LMP was Patient's last menstrual period was 02/15/2020 (exact date).  Subjective:   She presents today to establish care at our practice.  She believes she is approximately 11 weeks estimated gestational age.  She reports that she occasionally has nausea and vomiting in the morning but after that she is able to keep food and liquids down. Her first pregnancy was complicated by pyelonephritis with hospitalization and a postdates induction with vaginal vacuum delivery.  She is currently taking prenatal vitamins.     Hx: The following portions of the patient's history were reviewed and updated as appropriate:             She  has a past medical history of Anemia. She does not have any pertinent problems on file. She  has no past surgical history on file. Her family history is not on file. She  reports that she has never smoked. She has never used smokeless tobacco. She reports that she does not drink alcohol and does not use drugs. She has a current medication list which includes the following prescription(s): multivitamin-prenatal. She has No Known Allergies.       Review of Systems:  Review of Systems  Constitutional: Denied constitutional symptoms, night sweats, recent illness, fatigue, fever, insomnia and weight loss.  Eyes: Denied eye symptoms, eye pain, photophobia, vision change and visual disturbance.  Ears/Nose/Throat/Neck: Denied ear, nose, throat or neck symptoms, hearing loss, nasal discharge, sinus congestion and sore throat.  Cardiovascular: Denied cardiovascular symptoms, arrhythmia, chest pain/pressure, edema, exercise intolerance, orthopnea and palpitations.  Respiratory: Denied pulmonary symptoms, asthma, pleuritic pain, productive sputum, cough, dyspnea and wheezing.  Gastrointestinal: Denied, gastro-esophageal reflux, melena, nausea and vomiting.  Genitourinary: Denied genitourinary symptoms including  symptomatic vaginal discharge, pelvic relaxation issues, and urinary complaints.  Musculoskeletal: Denied musculoskeletal symptoms, stiffness, swelling, muscle weakness and myalgia.  Dermatologic: Denied dermatology symptoms, rash and scar.  Neurologic: Denied neurology symptoms, dizziness, headache, neck pain and syncope.  Psychiatric: Denied psychiatric symptoms, anxiety and depression.  Endocrine: Denied endocrine symptoms including hot flashes and night sweats.   Meds:   Current Outpatient Medications on File Prior to Visit  Medication Sig Dispense Refill  . Prenatal Vit-Fe Fumarate-FA (MULTIVITAMIN-PRENATAL) 27-0.8 MG TABS tablet Take 1 tablet by mouth daily at 12 noon.     No current facility-administered medications on file prior to visit.          Objective:     Vitals:   05/06/20 1024  BP: 126/78  Pulse: (!) 111   Filed Weights   05/06/20 1024  Weight: 133 lb 1.6 oz (60.4 kg)              Ultrasound results and lab work reviewed in detail directly with the patient  Assessment:    G2P1001 Patient Active Problem List   Diagnosis Date Noted  . Post-term pregnancy, 40-42 weeks of gestation 12/29/2017  . Pyelonephritis affecting pregnancy in second trimester 08/09/2017  . Abdominal pain 08/07/2017     1. Encounter to establish care     Patient believes she is approximately 11 weeks estimated gestational age.  Ultrasound results confirmed.   Plan:            Prenatal Plan 1.  The patient was given prenatal literature. 2.  She was continued on prenatal vitamins. 3.  A prenatal lab panel to be drawn at nurse visit. 4.  Her ultrasound was  reviewed.   5.  A nurse visit to be done at new OB visit 6.  Genetic testing and testing for other inheritable conditions discussed in detail.  She desires MaterniT 21 today.  7.  A general overview of pregnancy testing, visit schedule, ultrasound schedule, and prenatal care was discussed. 8.  Urine obtained for C&S  because of patient's history of pyelonephritis  I spent 33 minutes involved in the care of this patient preparing to see the patient by obtaining and reviewing her medical history (including labs, imaging tests and prior procedures), documenting clinical information in the electronic health record (EHR), counseling and coordinating care plans, writing and sending prescriptions, ordering tests or procedures and directly communicating with the patient by discussing pertinent items from her history and physical exam as well as detailing my assessment and plan as noted above so that she has an informed understanding.  All of her questions were answered.

## 2020-05-07 LAB — URINALYSIS, ROUTINE W REFLEX MICROSCOPIC
Bilirubin, UA: NEGATIVE
Glucose, UA: NEGATIVE
Leukocytes,UA: NEGATIVE
Nitrite, UA: POSITIVE — AB
RBC, UA: NEGATIVE
Specific Gravity, UA: 1.019 (ref 1.005–1.030)
Urobilinogen, Ur: 1 mg/dL (ref 0.2–1.0)
pH, UA: 7.5 (ref 5.0–7.5)

## 2020-05-07 LAB — MICROSCOPIC EXAMINATION: Casts: NONE SEEN /lpf

## 2020-05-08 LAB — RUBELLA SCREEN: Rubella Antibodies, IGG: 2.85 index (ref >0.99)

## 2020-05-08 LAB — VARICELLA ZOSTER ANTIBODY, IGG: Varicella zoster IgG: 699 index (ref >165)

## 2020-05-08 LAB — ABO AND RH: Rh Factor: POSITIVE

## 2020-05-08 LAB — HIV ANTIBODY (ROUTINE TESTING W REFLEX): HIV Screen 4th Generation wRfx: NONREACTIVE

## 2020-05-08 LAB — HEPATITIS B SURFACE ANTIGEN: Hepatitis B Surface Ag: NEGATIVE

## 2020-05-08 LAB — ANTIBODY SCREEN: Antibody Screen: NEGATIVE

## 2020-05-08 LAB — RPR: RPR Ser Ql: NONREACTIVE

## 2020-05-09 LAB — URINE CULTURE, OB REFLEX

## 2020-05-09 LAB — CULTURE, OB URINE

## 2020-05-10 LAB — GC/CHLAMYDIA PROBE AMP
Chlamydia trachomatis, NAA: NEGATIVE
Neisseria Gonorrhoeae by PCR: NEGATIVE

## 2020-05-11 ENCOUNTER — Other Ambulatory Visit: Payer: Self-pay

## 2020-05-11 ENCOUNTER — Telehealth: Payer: Self-pay

## 2020-05-11 ENCOUNTER — Inpatient Hospital Stay (HOSPITAL_COMMUNITY)
Admission: AD | Admit: 2020-05-11 | Discharge: 2020-05-11 | Disposition: A | Discharge status: Home / Self Care | Payer: Medicaid Other | Attending: Obstetrics and Gynecology | Admitting: Obstetrics and Gynecology

## 2020-05-11 ENCOUNTER — Encounter (HOSPITAL_COMMUNITY): Payer: Self-pay | Admitting: Obstetrics and Gynecology

## 2020-05-11 DIAGNOSIS — Z3A12 12 weeks gestation of pregnancy: Secondary | ICD-10-CM

## 2020-05-11 DIAGNOSIS — O2301 Infections of kidney in pregnancy, first trimester: Secondary | ICD-10-CM | POA: Diagnosis not present

## 2020-05-11 LAB — MATERNIT21  PLUS CORE+ESS+SCA, BLOOD

## 2020-05-11 LAB — URINALYSIS, ROUTINE W REFLEX MICROSCOPIC
Bilirubin Urine: NEGATIVE
Glucose, UA: NEGATIVE mg/dL
Hgb urine dipstick: NEGATIVE
Ketones, ur: 5 mg/dL — AB
Leukocytes,Ua: NEGATIVE
Nitrite: POSITIVE — AB
Protein, ur: NEGATIVE mg/dL
Specific Gravity, Urine: 1.011 (ref 1.005–1.030)
pH: 7 (ref 5.0–8.0)

## 2020-05-11 LAB — WET PREP, GENITAL
Clue Cells Wet Prep HPF POC: NONE SEEN
Sperm: NONE SEEN
Trich, Wet Prep: NONE SEEN
Yeast Wet Prep HPF POC: NONE SEEN

## 2020-05-11 MED ORDER — ONDANSETRON 4 MG PO TBDP
4.0000 mg | ORAL_TABLET | Freq: Once | ORAL | Status: AC
  Administered 2020-05-11: 4 mg via ORAL
  Filled 2020-05-11: qty 1

## 2020-05-11 MED ORDER — ONDANSETRON HCL 4 MG PO TABS: 4.0000 mg | ORAL_TABLET | Freq: Three times a day (TID) | ORAL | 1 refills | Status: DC | PRN

## 2020-05-11 MED ORDER — CEFDINIR 300 MG PO CAPS: 300.0000 mg | ORAL_CAPSULE | Freq: Two times a day (BID) | ORAL | 0 refills | Status: AC

## 2020-05-11 MED ORDER — LACTATED RINGERS IV BOLUS: 500.0000 mL | Freq: Once | INTRAVENOUS | Status: DC

## 2020-05-11 MED ORDER — SODIUM CHLORIDE 0.9 % IV BOLUS
500.0000 mL | Freq: Once | INTRAVENOUS | Status: AC
  Administered 2020-05-11: 18:00:00 500 mL via INTRAVENOUS

## 2020-05-11 MED ORDER — ACETAMINOPHEN 500 MG PO TABS
1000.0000 mg | ORAL_TABLET | Freq: Once | ORAL | Status: AC
  Administered 2020-05-11: 1000 mg via ORAL
  Filled 2020-05-11: qty 2

## 2020-05-11 MED ORDER — PRENATAL 27-0.8 MG PO TABS: 1.0000 | ORAL_TABLET | Freq: Every day | ORAL | 3 refills | Status: AC

## 2020-05-11 MED ORDER — SODIUM CHLORIDE 0.9 % IV SOLN
2.0000 g | Freq: Once | INTRAVENOUS | Status: AC
  Administered 2020-05-11: 18:00:00 2 g via INTRAVENOUS
  Filled 2020-05-11: qty 2

## 2020-05-11 NOTE — Telephone Encounter (Signed)
Unable to leave message via voice mail. Mychart message sent to patient to return call- per Dr Logan Bores culture positive for UTI.

## 2020-05-11 NOTE — MAU Provider Note (Signed)
Chief Complaint: Back Pain  Event Date/Time  First Provider Initiated Contact with Patient 05/11/20 1603     SUBJECTIVE HPI: Susan Hoover is a 19 y.o. G2P1001 at [redacted]w[redacted]d by L/6 who presents to maternity admissions reporting 2 days of worsening dysuria, vaginal discharge (now improved), and mild right-sided low back pain in the setting of early pregnancy. She did not take any medications prior to presenting to MAU today. Per EMR and pt report, she was seen in clinic on 12/16 to establish prenatal care, at which time she was without urinary tract symptoms. Her urine culture from her initial prenatal labs showed pan sensitive E. Coli. Pt received a call regarding result but has not received any antibiotics for treatment. Of note, pt was admitted for pyelonephritis in her prior pregnancy. She also endorses nausea and daily vomiting but good po intake.  She denies fever, chills, vaginal bleeding, vaginal itching/burning, h/a, dizziness, or fever/chills.  Past Medical History:  Diagnosis Date   Anemia    Past Surgical History:  Procedure Laterality Date   NO PAST SURGERIES     Social History   Socioeconomic History   Marital status: Single    Spouse name: Not on file   Number of children: Not on file   Years of education: Not on file   Highest education level: Not on file  Occupational History   Not on file  Tobacco Use   Smoking status: Never Smoker   Smokeless tobacco: Never Used  Substance and Sexual Activity   Alcohol use: No   Drug use: No   Sexual activity: Yes  Other Topics Concern   Not on file  Social History Narrative   Not on file   Social Determinants of Health   Financial Resource Strain: Not on file  Food Insecurity: Not on file  Transportation Needs: Not on file  Physical Activity: Not on file  Stress: Not on file  Social Connections: Not on file  Intimate Partner Violence: Not on file   No current facility-administered medications on file  prior to encounter.   No current outpatient medications on file prior to encounter.   No Known Allergies  ROS:  Review of Systems  Constitutional: Negative for appetite change, chills and fever.  Respiratory: Negative for cough, chest tightness and shortness of breath.   Cardiovascular: Negative for chest pain.  Gastrointestinal: Positive for abdominal pain, nausea and vomiting.  Endocrine: Positive for polyuria.  Genitourinary: Positive for dysuria, flank pain and vaginal discharge. Negative for vaginal bleeding and vaginal pain.  Musculoskeletal: Positive for back pain.  Neurological: Negative for dizziness, light-headedness and headaches.  All other systems reviewed and are negative.   I have reviewed patient's Past Medical Hx, Surgical Hx, Family Hx, Social Hx, medications and allergies.   Physical Exam   No data found.  Constitutional: Well-developed, well-nourished female in no acute distress.  Cardiovascular: normal rate Respiratory: normal effort GI: Abd soft, mild suprapubic tenderness MS: Extremities nontender, no edema, normal ROM Back: mild right CVA tenderness Neurologic: Alert and oriented x 4.  GU: deferred  FHT 158 by doppler  LAB RESULTS No results found for this or any previous visit (from the past 24 hour(s)).   A/Positive/-- (12/16 1111)  IMAGING No results found.  MAU Management/MDM: Orders Placed This Encounter  Procedures   Wet prep, genital   Urinalysis, Routine w reflex microscopic   Discharge patient Discharge disposition: 01-Home or Self Care; Discharge patient date: 05/11/2020 S/p IV antibiotics + IVF  Meds ordered this encounter  Medications   acetaminophen (TYLENOL) tablet 1,000 mg   ondansetron (ZOFRAN-ODT) disintegrating tablet 4 mg   cefTRIAXone (ROCEPHIN) 2 g in sodium chloride 0.9 % 100 mL IVPB    Order Specific Question:   Antibiotic Indication:    Answer:   Other Indication (list below)    Order Specific Question:    Other Indication:    Answer:   pyelonephritis   DISCONTD: lactated ringers bolus 500 mL   cefdinir (OMNICEF) 300 MG capsule    Sig: Take 1 capsule (300 mg total) by mouth 2 (two) times daily for 13 days.    Dispense:  26 capsule    Refill:  0   ondansetron (ZOFRAN) 4 MG tablet    Sig: Take 1 tablet (4 mg total) by mouth every 8 (eight) hours as needed for nausea or vomiting.    Dispense:  30 tablet    Refill:  1   sodium chloride 0.9 % bolus 500 mL   Prenatal Vit-Fe Fumarate-FA (MULTIVITAMIN-PRENATAL) 27-0.8 MG TABS tablet    Sig: Take 1 tablet by mouth daily at 12 noon.    Dispense:  90 tablet    Refill:  3    Consulted with Dr. Jolayne Panther who agreed with plan for 1 dose of antibiotics in MAU, followed by 13 days of po antibiotics for pyelonephritis at home. Also discussed antibiotic regimen with OB pharmacist on call.  Treatments in MAU included tylenol, zofran, IV ceftriaxone & IVF bolus. Pt discharged with strict return precautions for worsening urinary symptoms despite antibiotics.  ASSESSMENT & PLAN 1. Pyelonephritis affecting pregnancy in first trimester   Pt with positive Ucx at prenatal clinic on 12/16 without subsequent antibiotic treatment. Now presenting with 2 days of worsening dysuria and mild R CVA tenderness. Afebrile with stable vital signs. -s/p 2g ceftriaxone + IVF bolus in MAU -continue cefdinir 300mg  BID x13 days -instructed prn tylenol and prn zofran as needed for pain and nausea respectively -plan for f/u with prenatal provider on 12/30 as previously scheduled -return precautions for worsening symptoms provided  Discharge home Allergies as of 05/11/2020   No Known Allergies      Medication List     TAKE these medications    cefdinir 300 MG capsule Commonly known as: OMNICEF Take 1 capsule (300 mg total) by mouth 2 (two) times daily for 13 days.   multivitamin-prenatal 27-0.8 MG Tabs tablet Take 1 tablet by mouth daily at 12 noon.    ondansetron 4 MG tablet Commonly known as: Zofran Take 1 tablet (4 mg total) by mouth every 8 (eight) hours as needed for nausea or vomiting.         05/13/2020, MD OB Fellow, [facility] 05/12/2020 9:35 PM

## 2020-05-11 NOTE — Discharge Instructions (Signed)
Given your low back pain and recent positive urine, your symptoms as consistent with a kidney infection (pyelonephritis). You received IV antibiotics (ceftriaxone) in the Maternity Assessment Unit.  -It will be very important take cefdinir 1 tablet twice daily for your infection. -Stay well hydrated with drinking plenty of water. You may take tylenol (up to 1000mg  every 8 hours) for pain or fever. -Please call or re-present if concern of worsening symptoms despite your antibiotics, inability to tolerate fluids by mouth, persistent vomiting or other concerns. -Please follow-up with your prenatal provider as previously scheduled.  Pyelonephritis During Pregnancy  What are the causes? This condition is caused by a bacterial infection in the lower urinary tract that spreads to the kidney. What increases the risk? You are more likely to develop this condition if:  You have diabetes.  You have a history of frequent urinary tract infections. What are the signs or symptoms? Symptoms of this condition may begin with symptoms of a lower urinary tract infection. These may include:  A frequent urge to pass urine.  Burning pain when passing urine.  Pain and pressure in your lower abdomen.  Blood in your urine.  Cloudy or smelly urine. As the infection spreads to your kidney, you may have these symptoms:  Fever.  Chills.  Pain and tenderness in your upper abdomen or in your back and sides (flank pain). Flank pain often affects one side of the body, usually the right side.  Nausea, vomiting, or loss of appetite. How is this diagnosed? This condition may be diagnosed based on:  Your symptoms and medical history.  A physical exam.  Tests to confirm the diagnosis. These may include: ? Blood tests to check kidney function and look for signs of infection. ? Urine tests to check for signs of infection, including bacteria, white or red blood cells, and protein. ? Tests to grow and identify  the type of bacteria that is causing the infection (urine culture). ? Imaging studies of your kidneys to learn more about your condition. How is this treated? This condition is treated in the hospital with antibiotics that are given into one of your veins through an IV. Your health care provider:  Will start you on an antibiotic that is effective against common urinary tract infections.  May switch to another antibiotic if the results of your urine culture show that your infection is caused by different bacteria.  Will be careful to choose antibiotics that are the safest during pregnancy. Other treatments may include:  IV fluids if you are nauseous and not able to drink fluids.  Pain and fever medicines.  Medicines for nausea and vomiting. You will be able to go home when your infection is under control. To prevent another infection, you may need to continue taking antibiotics by mouth until your baby is born. Follow these instructions at home: Medicines   Take over-the-counter and prescription medicines only as told by your health care provider.  Take your antibiotic medicine as told by your health care provider. Do not stop taking the antibiotic even if you start to feel better.  Continue to take your prenatal vitamins. Lifestyle   Follow instructions from your health care provider about eating or drinking restrictions. You may need to avoid: ? Foods and drinks with added sugar. ? Caffeine and fruit juice.  Drink enough fluid to keep your urine pale yellow.  Go to the bathroom frequently. Do not hold your urine. Try to empty your bladder completely.  Change your  underwear every day. Wear all-cotton underwear. Do not wear tight underwear or pants. General instructions   Take these steps to lower the risk of bacteria getting into your urinary tract: ? Use liquid soap instead of bar soap when showering or bathing. Bacteria can grow on bar soap. ? When you wash yourself,  clean the urethra opening first. Use a washcloth to clean the area between your vagina and anus. Pat the area dry with a clean towel. ? Wash your hands before and after you go to the bathroom. ? Wipe yourself from front to back after going to the bathroom. ? Do not use douches, perfumed soap, creams, or powders. ? Do not soak in a bath for more than 30 minutes.  Return to your normal activities as told by your health care provider. Ask your health care provider what activities are safe for you.  Keep all follow-up visits as told by your health care provider. This is important. Contact a health care provider if:  You have chills or a fever.  You have any symptoms of infection that do not get better at home.  Symptoms of infection come back.  You have a reaction or side effects from your antibiotic. Get help right away if:  You start having contractions. Summary  Pyelonephritis is an infection of the kidney or kidneys.  This condition results when a bacterial infection in the lower urinary tract spreads to the kidney.  Lower urinary tract infections are common during pregnancy.  Pyelonephritis causes chills, a fever, flank pain, and nausea.  Pyelonephritis is a serious infection that is usually treated in the hospital with IV antibiotics. This information is not intended to replace advice given to you by your health care provider. Make sure you discuss any questions you have with your health care provider. Document Revised: 08/30/2018 Document Reviewed: 08/09/2017 Elsevier Patient Education  2020 [employer].

## 2020-05-11 NOTE — MAU Note (Signed)
Pt stated pain in lower back and pain while urinating started 2 days ago. Lower back pain intermittent and rates 7/10. Pt reports urine having foul odor. Pt stated she had UTI labs draw from provider office on 05/06/2020 and read on my chart yesterday she was positive for UTI, Provider did not give pt medication.

## 2020-05-20 ENCOUNTER — Ambulatory Visit (INDEPENDENT_AMBULATORY_CARE_PROVIDER_SITE_OTHER): Payer: Medicaid Other | Admitting: Obstetrics and Gynecology

## 2020-05-20 ENCOUNTER — Encounter: Payer: Self-pay | Admitting: Obstetrics and Gynecology

## 2020-05-20 ENCOUNTER — Other Ambulatory Visit: Payer: Self-pay

## 2020-05-20 VITALS — BP 108/73 | HR 80 | Wt 138.1 lb

## 2020-05-20 DIAGNOSIS — N3000 Acute cystitis without hematuria: Secondary | ICD-10-CM

## 2020-05-20 DIAGNOSIS — Z3491 Encounter for supervision of normal pregnancy, unspecified, first trimester: Secondary | ICD-10-CM

## 2020-05-20 DIAGNOSIS — Z3A12 12 weeks gestation of pregnancy: Secondary | ICD-10-CM

## 2020-05-20 MED ORDER — NITROFURANTOIN MONOHYD MACRO 100 MG PO CAPS: 100.0000 mg | ORAL_CAPSULE | ORAL | 1 refills | Status: AC

## 2020-05-20 NOTE — Progress Notes (Signed)
NOB: History of Pilo in this pregnancy.  Most recent urinalysis revealed E. coli infection.  Patient now on antibiotics.  I have also prescribed daily Macrobid for the remainder of pregnancy.  I discussed this with her in detail. She is otherwise doing well.  Has no complaints.  12 weeks estimated gestational age.  Denies nausea and vomiting denies vaginal bleeding.  aFP next visit Physical examination General NAD, Conversant  HEENT Atraumatic; Op clear with mmm.  Normo-cephalic. Pupils reactive. Anicteric sclerae  Thyroid/Neck Smooth without nodularity or enlargement. Normal ROM.  Neck Supple.  Skin No rashes, lesions or ulceration. Normal palpated skin turgor. No nodularity.  Breasts: No masses or discharge.  Symmetric.  No axillary adenopathy.  Lungs: Clear to auscultation.No rales or wheezes. Normal Respiratory effort, no retractions.  Heart: NSR.  No murmurs or rubs appreciated. No periferal edema  Abdomen: Soft.  Non-tender.  No masses.  No HSM. No hernia  Extremities: Moves all appropriately.  Normal ROM for age. No lymphadenopathy.  Neuro: Oriented to PPT.  Normal mood. Normal affect.     Pelvic:   Vulva: Normal appearance.  No lesions.  Vagina: No lesions or abnormalities noted.  Support: Normal pelvic support.  Urethra No masses tenderness or scarring.  Meatus Normal size without lesions or prolapse.  Cervix: Normal appearance.  No lesions.  Anus: Normal exam.  No lesions.  Perineum: Normal exam.  No lesions.        Bimanual   Adnexae: No masses.  Non-tender to palpation.  Uterus: Enlarged. 12 wks  POS FHTs  Non-tender.  Mobile.  AV.  Adnexae: No masses.  Non-tender to palpation.  Cul-de-sac: Negative for abnormality.  Adnexae: No masses.  Non-tender to palpation.         Pelvimetry   Diagonal: Reached.  Spines: Average.  Sacrum: Concave.  Pubic Arch: Normal.

## 2020-05-22 NOTE — L&D Delivery Note (Signed)
       Delivery Note   Corby Villasenor Bryanna Yim is a 20 y.o. G2P1001 at [redacted]w[redacted]d Estimated Date of Delivery: 11/28/20  PRE-OPERATIVE DIAGNOSIS:  1) [redacted]w[redacted]d pregnancy.  2) Active labor  POST-OPERATIVE DIAGNOSIS:  1) [redacted]w[redacted]d pregnancy s/p Vaginal, Spontaneous    Delivery Type: Vaginal, Spontaneous    Delivery Anesthesia: Epidural   Labor Complications:      ESTIMATED BLOOD LOSS: 125 ml   FINDINGS:   1) female infant, Apgar scores of 8   at 1 minute and 9   at 5 minutes and a birthweight of   ounces.    2) Nuchal cord: NO  SPECIMENS:   PLACENTA:   Appearance: Intact    Removal: Spontaneous      Disposition:    DISPOSITION:  Infant to left in stable condition in the delivery room, with L&D personnel and mother,  NARRATIVE SUMMARY: Labor course:  Ms. Anadelia Kintz is a G2P1001 at [redacted]w[redacted]d who presented for labor management.  She progressed well in labor with pitocin.  She received the appropriate anesthesia and proceeded to complete dilation.She underwent SROM - clear fluid. She evidenced good maternal expulsive effort during the second stage. She went on to deliver a viable infant. The placenta delivered without problems and was noted to be complete. A perineal and vaginal examination was performed. Episiotomy/Lacerations:  Stana Bunting, M.D. 12/04/2020 5:19 PM

## 2020-05-24 DIAGNOSIS — Z20822 Contact with and (suspected) exposure to covid-19: Secondary | ICD-10-CM | POA: Diagnosis not present

## 2020-05-24 LAB — POCT URINALYSIS DIPSTICK OB
Bilirubin, UA: NEGATIVE
Blood, UA: NEGATIVE
Glucose, UA: NEGATIVE
Ketones, UA: NEGATIVE
Leukocytes, UA: NEGATIVE
Nitrite, UA: NEGATIVE
POC,PROTEIN,UA: NEGATIVE
Spec Grav, UA: 1.01 (ref 1.010–1.025)
Urobilinogen, UA: 0.2 E.U./dL
pH, UA: 6 (ref 5.0–8.0)

## 2020-05-24 NOTE — Addendum Note (Signed)
Addended by: Dorian Pod on: 05/24/2020 11:11 AM   Modules accepted: Orders

## 2020-06-18 ENCOUNTER — Ambulatory Visit (INDEPENDENT_AMBULATORY_CARE_PROVIDER_SITE_OTHER): Payer: Medicaid Other | Admitting: Obstetrics and Gynecology

## 2020-06-18 ENCOUNTER — Encounter: Payer: Self-pay | Admitting: Obstetrics and Gynecology

## 2020-06-18 VITALS — BP 107/70 | HR 80 | Wt 138.9 lb

## 2020-06-18 DIAGNOSIS — Z3A16 16 weeks gestation of pregnancy: Secondary | ICD-10-CM

## 2020-06-18 DIAGNOSIS — Z1379 Encounter for other screening for genetic and chromosomal anomalies: Secondary | ICD-10-CM

## 2020-06-18 DIAGNOSIS — Z3482 Encounter for supervision of other normal pregnancy, second trimester: Secondary | ICD-10-CM | POA: Diagnosis not present

## 2020-06-18 DIAGNOSIS — Z87440 Personal history of urinary (tract) infections: Secondary | ICD-10-CM

## 2020-06-18 DIAGNOSIS — N3000 Acute cystitis without hematuria: Secondary | ICD-10-CM | POA: Diagnosis not present

## 2020-06-18 DIAGNOSIS — Z23 Encounter for immunization: Secondary | ICD-10-CM | POA: Diagnosis not present

## 2020-06-18 DIAGNOSIS — Z8759 Personal history of other complications of pregnancy, childbirth and the puerperium: Secondary | ICD-10-CM

## 2020-06-18 LAB — POCT URINALYSIS DIPSTICK OB
Bilirubin, UA: NEGATIVE
Glucose, UA: NEGATIVE
Ketones, UA: NEGATIVE
POC,PROTEIN,UA: NEGATIVE
Spec Grav, UA: 1.015 (ref 1.010–1.025)
Urobilinogen, UA: NEGATIVE E.U./dL — AB
pH, UA: 7 (ref 5.0–8.0)

## 2020-06-18 MED ORDER — CEFTRIAXONE SODIUM 1 G IJ SOLR
1.0000 g | INTRAMUSCULAR | Status: DC
  Administered 2020-06-18: 1 g via INTRAMUSCULAR

## 2020-06-18 NOTE — Progress Notes (Signed)
ROB-Pt present for routine prenatal care. Pt c/o kidney pain and cramping in the abd area when she is having kidney pain.

## 2020-06-18 NOTE — Patient Instructions (Signed)
Second Trimester of Pregnancy  The second trimester of pregnancy is from week 13 through week 27. This is months 4 through 6 of pregnancy. The second trimester is often a time when you feel your best. Your body has adjusted to being pregnant, and you begin to feel better physically. During the second trimester:  Morning sickness has lessened or stopped completely.  You may have more energy.  You may have an increase in appetite. The second trimester is also a time when the unborn baby (fetus) is growing rapidly. At the end of the sixth month, the fetus may be up to 12 inches long and weigh about 1 pounds. You will likely begin to feel the baby move (quickening) between 16 and 20 weeks of pregnancy. Body changes during your second trimester Your body continues to go through many changes during your second trimester. The changes vary and generally return to normal after the baby is born. Physical changes  Your weight will continue to increase. You will notice your lower abdomen bulging out.  You may begin to get stretch marks on your hips, abdomen, and breasts.  Your breasts will continue to grow and to become tender.  Dark spots or blotches (chloasma or mask of pregnancy) may develop on your face.  A dark line from your belly button to the pubic area (linea nigra) may appear.  You may have changes in your hair. These can include thickening of your hair, rapid growth, and changes in texture. Some people also have hair loss during or after pregnancy, or hair that feels dry or thin. Health changes  You may develop headaches.  You may have heartburn.  You may develop constipation.  You may develop hemorrhoids or swollen, bulging veins (varicose veins).  Your gums may bleed and may be sensitive to brushing and flossing.  You may urinate more often because the fetus is pressing on your bladder.  You may have back pain. This is caused by: ? Weight gain. ? Pregnancy hormones that  are relaxing the joints in your pelvis. ? A shift in weight and the muscles that support your balance. Follow these instructions at home: Medicines  Follow your health care provider's instructions regarding medicine use. Specific medicines may be either safe or unsafe to take during pregnancy. Do not take any medicines unless approved by your health care provider.  Take a prenatal vitamin that contains at least 600 micrograms (mcg) of folic acid. Eating and drinking  Eat a healthy diet that includes fresh fruits and vegetables, whole grains, good sources of protein such as meat, eggs, or tofu, and low-fat dairy products.  Avoid raw meat and unpasteurized juice, milk, and cheese. These carry germs that can harm you and your baby.  You may need to take these actions to prevent or treat constipation: ? Drink enough fluid to keep your urine pale yellow. ? Eat foods that are high in fiber, such as beans, whole grains, and fresh fruits and vegetables. ? Limit foods that are high in fat and processed sugars, such as fried or sweet foods. Activity  Exercise only as directed by your health care provider. Most people can continue their usual exercise routine during pregnancy. Try to exercise for 30 minutes at least 5 days a week. Stop exercising if you develop contractions in your uterus.  Stop exercising if you develop pain or cramping in the lower abdomen or lower back.  Avoid exercising if it is very hot or humid or if you are at   a high altitude.  Avoid heavy lifting.  If you choose to, you may have sex unless your health care provider tells you not to. Relieving pain and discomfort  Wear a supportive bra to prevent discomfort from breast tenderness.  Take warm sitz baths to soothe any pain or discomfort caused by hemorrhoids. Use hemorrhoid cream if your health care provider approves.  Rest with your legs raised (elevated) if you have leg cramps or low back pain.  If you develop  varicose veins: ? Wear support hose as told by your health care provider. ? Elevate your feet for 15 minutes, 3-4 times a day. ? Limit salt in your diet. Safety  Wear your seat belt at all times when driving or riding in a car.  Talk with your health care provider if someone is verbally or physically abusive to you. Lifestyle  Do not use hot tubs, steam rooms, or saunas.  Do not douche. Do not use tampons or scented sanitary pads.  Avoid cat litter boxes and soil used by cats. These carry germs that can cause birth defects in the baby and possibly loss of the fetus by miscarriage or stillbirth.  Do not use herbal remedies, alcohol, illegal drugs, or medicines that are not approved by your health care provider. Chemicals in these products can harm your baby.  Do not use any products that contain nicotine or tobacco, such as cigarettes, e-cigarettes, and chewing tobacco. If you need help quitting, ask your health care provider. General instructions  During a routine prenatal visit, your health care provider will do a physical exam and other tests. He or she will also discuss your overall health. Keep all follow-up visits. This is important.  Ask your health care provider for a referral to a local prenatal education class.  Ask for help if you have counseling or nutritional needs during pregnancy. Your health care provider can offer advice or refer you to specialists for help with various needs. Where to find more information  American Pregnancy Association: americanpregnancy.org  American College of Obstetricians and Gynecologists: acog.org/en/Womens%20Health/Pregnancy  Office on Women's Health: womenshealth.gov/pregnancy Contact a health care provider if you have:  A headache that does not go away when you take medicine.  Vision changes or you see spots in front of your eyes.  Mild pelvic cramps, pelvic pressure, or nagging pain in the abdominal area.  Persistent nausea,  vomiting, or diarrhea.  A bad-smelling vaginal discharge or foul-smelling urine.  Pain when you urinate.  Sudden or extreme swelling of your face, hands, ankles, feet, or legs.  A fever. Get help right away if you:  Have fluid leaking from your vagina.  Have spotting or bleeding from your vagina.  Have severe abdominal cramping or pain.  Have difficulty breathing.  Have chest pain.  Have fainting spells.  Have not felt your baby move for the time period told by your health care provider.  Have new or increased pain, swelling, or redness in an arm or leg. Summary  The second trimester of pregnancy is from week 13 through week 27 (months 4 through 6).  Do not use herbal remedies, alcohol, illegal drugs, or medicines that are not approved by your health care provider. Chemicals in these products can harm your baby.  Exercise only as directed by your health care provider. Most people can continue their usual exercise routine during pregnancy.  Keep all follow-up visits. This is important. This information is not intended to replace advice given to you by your   health care provider. Make sure you discuss any questions you have with your health care provider. Document Revised: 10/15/2019 Document Reviewed: 08/21/2019 Elsevier Patient Education  2021 Elsevier Inc.   Influenza (Flu) Vaccine (Inactivated or Recombinant): What You Need to Know 1. Why get vaccinated? Influenza vaccine can prevent influenza (flu). Flu is a contagious disease that spreads around the [place] every year, usually between October and May. Anyone can get the flu, but it is more dangerous for some people. Infants and young children, people 19 years and older, pregnant people, and people with certain health conditions or a weakened immune system are at greatest risk of flu complications. Pneumonia, bronchitis, sinus infections, and ear infections are examples of flu-related complications. If you have a  medical condition, such as heart disease, cancer, or diabetes, flu can make it worse. Flu can cause fever and chills, sore throat, muscle aches, fatigue, cough, headache, and runny or stuffy nose. Some people may have vomiting and diarrhea, though this is more common in children than adults. In an average year, thousands of people in the [place] die from flu, and many more are hospitalized. Flu vaccine prevents millions of illnesses and flu-related visits to the doctor each year. 2. Influenza vaccines CDC recommends everyone 6 months and older get vaccinated every flu season. Children 6 months through 68 years of age may need 2 doses during a single flu season. Everyone else needs only 1 dose each flu season. It takes about 2 weeks for protection to develop after vaccination. There are many flu viruses, and they are always changing. Each year a new flu vaccine is made to protect against the influenza viruses believed to be likely to cause disease in the upcoming flu season. Even when the vaccine doesn't exactly match these viruses, it may still provide some protection. Influenza vaccine does not cause flu. Influenza vaccine may be given at the same time as other vaccines. 3. Talk with your health care provider Tell your vaccination provider if the person getting the vaccine:  Has had an allergic reaction after a previous dose of influenza vaccine, or has any severe, life-threatening allergies  Has ever had Guillain-Barr Syndrome (also called "GBS") In some cases, your health care provider may decide to postpone influenza vaccination until a future visit. Influenza vaccine can be administered at any time during pregnancy. People who are or will be pregnant during influenza season should receive inactivated influenza vaccine. People with minor illnesses, such as a cold, may be vaccinated. People who are moderately or severely ill should usually wait until they recover before getting influenza  vaccine. Your health care provider can give you more information. 4. Risks of a vaccine reaction  Soreness, redness, and swelling where the shot is given, fever, muscle aches, and headache can happen after influenza vaccination.  There may be a very small increased risk of Guillain-Barr Syndrome (GBS) after inactivated influenza vaccine (the flu shot). Young children who get the flu shot along with pneumococcal vaccine (PCV13) and/or DTaP vaccine at the same time might be slightly more likely to have a seizure caused by fever. Tell your health care provider if a child who is getting flu vaccine has ever had a seizure. People sometimes faint after medical procedures, including vaccination. Tell your provider if you feel dizzy or have vision changes or ringing in the ears. As with any medicine, there is a very remote chance of a vaccine causing a severe allergic reaction, other serious injury, or death. 5. What  if there is a serious problem? An allergic reaction could occur after the vaccinated person leaves the clinic. If you see signs of a severe allergic reaction (hives, swelling of the face and throat, difficulty breathing, a fast heartbeat, dizziness, or weakness), call 9-1-1 and get the person to the nearest hospital. For other signs that concern you, call your health care provider. Adverse reactions should be reported to the Vaccine Adverse Event Reporting System (VAERS). Your health care provider will usually file this report, or you can do it yourself. Visit the VAERS website at www.vaers.LAgents.no or call 628-467-4953. VAERS is only for reporting reactions, and VAERS staff members do not give medical advice. 6. The National Vaccine Injury Compensation Program The [employer] Vaccine Injury Compensation Program (VICP) is a federal program that was created to compensate people who may have been injured by certain vaccines. Claims regarding alleged injury or death due to vaccination have a time  limit for filing, which may be as short as two years. Visit the VICP website at SpiritualWord.at or call (340)555-1146 to learn about the program and about filing a claim. 7. How can I learn more?  Ask your health care provider.  Call your local or state health department.  Visit the website of the Food and Drug Administration (FDA) for vaccine package inserts and additional information at FinderList.no.  Contact the Centers for Disease Control and Prevention (CDC): ? Call 559-812-0261 (1-800-CDC-INFO) or ? Visit CDC's website at BiotechRoom.com.cy. Vaccine Information Statement Inactivated Influenza Vaccine (12/26/2019) This information is not intended to replace advice given to you by your health care provider. Make sure you discuss any questions you have with your health care provider. Document Revised: 02/12/2020 Document Reviewed: 02/12/2020 Elsevier Patient Education  2021 [employer].

## 2020-06-18 NOTE — Progress Notes (Signed)
ROB: Patient still complaining of back pain and pelvic cramping. Has been ongoing since hospitalization for pyelonephritis. Denies h/o kidney stones. Had h/o pyelonephritis in prior pregnancy as well. Is taking Macrobid for suppression as prescribed but UA today with nitrates. Will give dose of IM Rocephin 1 gram. Will also repeat urine culture.  Discussed increasing hydration and use of Azo.  If still no improvement, can consider renal ultrasound (last sono reviewed from 2019 just noting expected mild hydronephrosis of pregnancy). Normal MaterniT21, for AFP today. For flu vaccine today. RTC in 4 weeks, for anatomy scan then.

## 2020-06-22 LAB — URINE CULTURE

## 2020-06-23 ENCOUNTER — Other Ambulatory Visit: Payer: Medicaid Other

## 2020-07-13 ENCOUNTER — Other Ambulatory Visit: Payer: Medicaid Other

## 2020-07-13 ENCOUNTER — Encounter: Payer: Self-pay | Admitting: Obstetrics and Gynecology

## 2020-07-13 ENCOUNTER — Other Ambulatory Visit: Payer: Self-pay

## 2020-07-13 ENCOUNTER — Ambulatory Visit (INDEPENDENT_AMBULATORY_CARE_PROVIDER_SITE_OTHER): Payer: Medicaid Other | Admitting: Obstetrics and Gynecology

## 2020-07-13 VITALS — BP 111/75 | HR 103 | Wt 146.5 lb

## 2020-07-13 DIAGNOSIS — Z1379 Encounter for other screening for genetic and chromosomal anomalies: Secondary | ICD-10-CM | POA: Diagnosis not present

## 2020-07-13 DIAGNOSIS — Z3A20 20 weeks gestation of pregnancy: Secondary | ICD-10-CM

## 2020-07-13 DIAGNOSIS — Z3482 Encounter for supervision of other normal pregnancy, second trimester: Secondary | ICD-10-CM

## 2020-07-13 LAB — POCT URINALYSIS DIPSTICK OB
Bilirubin, UA: NEGATIVE
Blood, UA: NEGATIVE
Glucose, UA: NEGATIVE
Ketones, UA: NEGATIVE
Leukocytes, UA: NEGATIVE
Nitrite, UA: NEGATIVE
POC,PROTEIN,UA: NEGATIVE
Spec Grav, UA: 1.01 (ref 1.010–1.025)
Urobilinogen, UA: 0.2 E.U./dL
pH, UA: 6 (ref 5.0–8.0)

## 2020-07-13 NOTE — Progress Notes (Signed)
ROB: FAS re-scheduled to tomorrow. No complaints. Daily fetal movement. Patient does describe heart racing when she gets around crowds. She says she has had this before. I suspect mild panic attacks. We have discussed panic attacks in detail. Avoidance of triggers and self monitoring and control discussed. aFP today. Continue daily Macrobid.

## 2020-07-14 ENCOUNTER — Ambulatory Visit (INDEPENDENT_AMBULATORY_CARE_PROVIDER_SITE_OTHER): Payer: Medicaid Other

## 2020-07-14 ENCOUNTER — Other Ambulatory Visit: Payer: Self-pay

## 2020-07-14 DIAGNOSIS — Z3482 Encounter for supervision of other normal pregnancy, second trimester: Secondary | ICD-10-CM

## 2020-07-20 LAB — AFP, SERUM, OPEN SPINA BIFIDA
AFP MoM: 1.04
AFP Value: 64.9 ng/mL
Gest. Age on Collection Date: 20.3 weeks
Maternal Age At EDD: 20.6 yr
OSBR Risk 1 IN: 10000
Test Results:: NEGATIVE
Weight: 139 [lb_av]

## 2020-07-25 ENCOUNTER — Other Ambulatory Visit: Payer: Self-pay | Admitting: Obstetrics and Gynecology

## 2020-07-25 DIAGNOSIS — Z3482 Encounter for supervision of other normal pregnancy, second trimester: Secondary | ICD-10-CM

## 2020-07-25 DIAGNOSIS — Z362 Encounter for other antenatal screening follow-up: Secondary | ICD-10-CM

## 2020-07-27 ENCOUNTER — Emergency Department
Admission: EM | Admit: 2020-07-27 | Discharge: 2020-07-28 | Disposition: A | Discharge status: Home / Self Care | Payer: Medicaid Other | Attending: Student in an Organized Health Care Education/Training Program | Admitting: Student in an Organized Health Care Education/Training Program

## 2020-07-27 ENCOUNTER — Encounter: Payer: Self-pay | Admitting: Emergency Medicine

## 2020-07-27 ENCOUNTER — Other Ambulatory Visit: Payer: Self-pay

## 2020-07-27 DIAGNOSIS — R42 Dizziness and giddiness: Secondary | ICD-10-CM

## 2020-07-27 DIAGNOSIS — O99412 Diseases of the circulatory system complicating pregnancy, second trimester: Secondary | ICD-10-CM | POA: Insufficient documentation

## 2020-07-27 DIAGNOSIS — O26812 Pregnancy related exhaustion and fatigue, second trimester: Secondary | ICD-10-CM | POA: Diagnosis not present

## 2020-07-27 DIAGNOSIS — R110 Nausea: Secondary | ICD-10-CM | POA: Insufficient documentation

## 2020-07-27 DIAGNOSIS — Z3A22 22 weeks gestation of pregnancy: Secondary | ICD-10-CM | POA: Diagnosis not present

## 2020-07-27 DIAGNOSIS — R002 Palpitations: Secondary | ICD-10-CM

## 2020-07-27 LAB — BASIC METABOLIC PANEL
Anion gap: 5 (ref 5–15)
BUN: 8 mg/dL (ref 6–20)
CO2: 19 mmol/L — ABNORMAL LOW (ref 22–32)
Calcium: 8.4 mg/dL — ABNORMAL LOW (ref 8.9–10.3)
Chloride: 111 mmol/L (ref 98–111)
Creatinine, Ser: <0.3 mg/dL — ABNORMAL LOW (ref 0.44–1.00)
Glucose, Bld: 102 mg/dL — ABNORMAL HIGH (ref 70–99)
Potassium: 3.5 mmol/L (ref 3.5–5.1)
Sodium: 135 mmol/L (ref 135–145)

## 2020-07-27 LAB — CBC WITH DIFFERENTIAL/PLATELET
Abs Immature Granulocytes: 0.06 10*3/uL (ref 0.00–0.07)
Basophils Absolute: 0 10*3/uL (ref 0.0–0.1)
Basophils Relative: 0 %
Eosinophils Absolute: 0.1 10*3/uL (ref 0.0–0.5)
Eosinophils Relative: 1 %
HCT: 30.8 % — ABNORMAL LOW (ref 36.0–46.0)
Hemoglobin: 9.8 g/dL — ABNORMAL LOW (ref 12.0–15.0)
Immature Granulocytes: 1 %
Lymphocytes Relative: 20 %
Lymphs Abs: 2 10*3/uL (ref 0.7–4.0)
MCH: 28.1 pg (ref 26.0–34.0)
MCHC: 31.8 g/dL (ref 30.0–36.0)
MCV: 88.3 fL (ref 80.0–100.0)
Monocytes Absolute: 0.5 10*3/uL (ref 0.1–1.0)
Monocytes Relative: 5 %
Neutro Abs: 7.1 10*3/uL (ref 1.7–7.7)
Neutrophils Relative %: 73 %
Platelets: 184 10*3/uL (ref 150–400)
RBC: 3.49 MIL/uL — ABNORMAL LOW (ref 3.87–5.11)
RDW: 13.6 % (ref 11.5–15.5)
WBC: 9.7 10*3/uL (ref 4.0–10.5)
nRBC: 0 % (ref 0.0–0.2)

## 2020-07-27 LAB — TROPONIN I (HIGH SENSITIVITY): Troponin I (High Sensitivity): 3 ng/L (ref <18)

## 2020-07-27 MED ORDER — SODIUM CHLORIDE 0.9 % IV BOLUS
1000.0000 mL | Freq: Once | INTRAVENOUS | Status: AC
  Administered 2020-07-27: 1000 mL via INTRAVENOUS

## 2020-07-27 NOTE — ED Notes (Signed)
Pt reports feeling dizzy and heart racing for 1 week.  No v/d.  Pt is apporx [redacted] weeks pregnant.  G2p1a0.  Pt denies abd pain.  No vag bleeding.  No chest pain now or sob.  Pt alert  Speech clear.  nsr on monitor

## 2020-07-27 NOTE — ED Triage Notes (Signed)
Pt states earlier tonight she felt her heart start racing and dizzy, pt sat down and checked BP and it was 94 systolic. Pt states she still feels like her heart is racing, denies dizziness. Pt is [redacted] weeks pregnant.

## 2020-07-27 NOTE — ED Provider Notes (Signed)
[facility] Emergency Department Provider Note    Event Date/Time   First MD Initiated Contact with Patient 07/27/20 2259     (approximate)  I have reviewed the triage vital signs and the nursing notes.   HISTORY  Chief Complaint Hypotension and Palpitations    HPI [address] Susan Hoover is a 20 y.o. female G2, P1 roughly [redacted] weeks pregnant presents to the ER for evaluation of lightheadedness as well as some palpitations.  This occurred while she was washing dishes.  Denies any pain.  Did feel some nausea.  Not had any vomiting.  Denies any chest pain or shortness of breath.  This occurred while she was standing up.  Felt better after she sat down.  Has not had any dysuria.  No bleeding or discharge.  Checked her blood pressure at home and it was low when she was directed to the ER by her brother who is a first responder.    Past Medical History:  Diagnosis Date  . Anemia    No family history on file. Past Surgical History:  Procedure Laterality Date  . NO PAST SURGERIES     Patient Active Problem List   Diagnosis Date Noted  . History of pyelonephritis during pregnancy 06/18/2020  . Post-term pregnancy, 40-42 weeks of gestation 12/29/2017  . Pyelonephritis affecting pregnancy in second trimester 08/09/2017  . Abdominal pain 08/07/2017      Prior to Admission medications   Medication Sig Start Date End Date Taking? Authorizing Provider  cephALEXin (KEFLEX) 500 MG capsule Take 1 capsule (500 mg total) by mouth 3 (three) times daily for 7 days. 07/28/20 08/04/20 Yes Willy Eddy, MD  nitrofurantoin, macrocrystal-monohydrate, (MACROBID) 100 MG capsule Take 1 capsule (100 mg total) by mouth 1 day or 1 dose. 05/20/20 08/18/20  Linzie Collin, MD  ondansetron (ZOFRAN) 4 MG tablet Take 1 tablet (4 mg total) by mouth every 8 (eight) hours as needed for nausea or vomiting. 05/11/20 05/11/21  Sheila Oats, MD  Prenatal Vit-Fe Fumarate-FA  (MULTIVITAMIN-PRENATAL) 27-0.8 MG TABS tablet Take 1 tablet by mouth daily at 12 noon. 05/11/20   Sheila Oats, MD    Allergies Patient has no known allergies.    Social History Social History   Tobacco Use  . Smoking status: Never Smoker  . Smokeless tobacco: Never Used  Vaping Use  . Vaping Use: Never used  Substance Use Topics  . Alcohol use: No  . Drug use: No    Review of Systems Patient denies headaches, rhinorrhea, blurry vision, numbness, shortness of breath, chest pain, edema, cough, abdominal pain, nausea, vomiting, diarrhea, dysuria, fevers, rashes or hallucinations unless otherwise stated above in HPI. ____________________________________________   PHYSICAL EXAM:  VITAL SIGNS: Vitals:   07/27/20 2244  BP: 107/62  Pulse: 84  Resp: 16  Temp: 98.3 F (36.8 C)  SpO2: 98%    Constitutional: Alert and oriented.  Eyes: Conjunctivae are normal.  Head: Atraumatic. Nose: No congestion/rhinnorhea. Mouth/Throat: Mucous membranes are moist.   Neck: No stridor. Painless ROM.  Cardiovascular: Normal rate, regular rhythm. Grossly normal heart sounds.  Good peripheral circulation. Respiratory: Normal respiratory effort.  No retractions. Lungs CTAB. Gastrointestinal: Soft and nontender. No distention. No abdominal bruits. No CVA tenderness. Genitourinary:  Musculoskeletal: No lower extremity tenderness nor edema.  No joint effusions. Neurologic:  Normal speech and language. No gross focal neurologic deficits are appreciated. No facial droop Skin:  Skin is warm, dry and intact. No rash noted. Psychiatric:  Mood and affect are normal. Speech and behavior are normal.  ____________________________________________   LABS (all labs ordered are listed, but only abnormal results are displayed)  Results for orders placed or performed during the hospital encounter of 07/27/20 (from the past 24 hour(s))  CBC with Differential     Status: Abnormal   Collection Time:  07/27/20 10:51 PM  Result Value Ref Range   WBC 9.7 4.0 - 10.5 K/uL   RBC 3.49 (L) 3.87 - 5.11 MIL/uL   Hemoglobin 9.8 (L) 12.0 - 15.0 g/dL   HCT 16.0 (L) 10.9 - 32.3 %   MCV 88.3 80.0 - 100.0 fL   MCH 28.1 26.0 - 34.0 pg   MCHC 31.8 30.0 - 36.0 g/dL   RDW 55.7 32.2 - 02.5 %   Platelets 184 150 - 400 K/uL   nRBC 0.0 0.0 - 0.2 %   Neutrophils Relative % 73 %   Neutro Abs 7.1 1.7 - 7.7 K/uL   Lymphocytes Relative 20 %   Lymphs Abs 2.0 0.7 - 4.0 K/uL   Monocytes Relative 5 %   Monocytes Absolute 0.5 0.1 - 1.0 K/uL   Eosinophils Relative 1 %   Eosinophils Absolute 0.1 0.0 - 0.5 K/uL   Basophils Relative 0 %   Basophils Absolute 0.0 0.0 - 0.1 K/uL   Immature Granulocytes 1 %   Abs Immature Granulocytes 0.06 0.00 - 0.07 K/uL  Basic metabolic panel     Status: Abnormal   Collection Time: 07/27/20 10:51 PM  Result Value Ref Range   Sodium 135 135 - 145 mmol/L   Potassium 3.5 3.5 - 5.1 mmol/L   Chloride 111 98 - 111 mmol/L   CO2 19 (L) 22 - 32 mmol/L   Glucose, Bld 102 (H) 70 - 99 mg/dL   BUN 8 6 - 20 mg/dL   Creatinine, Ser <4.27 (L) 0.44 - 1.00 mg/dL   Calcium 8.4 (L) 8.9 - 10.3 mg/dL   GFR, Estimated NOT CALCULATED >60 mL/min   Anion gap 5 5 - 15  Troponin I (High Sensitivity)     Status: None   Collection Time: 07/27/20 10:51 PM  Result Value Ref Range   Troponin I (High Sensitivity) 3 <18 ng/L  Urinalysis, Complete w Microscopic Urine, Clean Catch     Status: Abnormal   Collection Time: 07/28/20 12:22 AM  Result Value Ref Range   Color, Urine YELLOW (A) YELLOW   APPearance CLOUDY (A) CLEAR   Specific Gravity, Urine 1.012 1.005 - 1.030   pH 8.0 5.0 - 8.0   Glucose, UA NEGATIVE NEGATIVE mg/dL   Hgb urine dipstick NEGATIVE NEGATIVE   Bilirubin Urine NEGATIVE NEGATIVE   Ketones, ur NEGATIVE NEGATIVE mg/dL   Protein, ur NEGATIVE NEGATIVE mg/dL   Nitrite NEGATIVE NEGATIVE   Leukocytes,Ua LARGE (A) NEGATIVE   RBC / HPF 0-5 0 - 5 RBC/hpf   WBC, UA 21-50 0 - 5 WBC/hpf    Bacteria, UA FEW (A) NONE SEEN   Squamous Epithelial / LPF 11-20 0 - 5   Mucus PRESENT    Amorphous Crystal PRESENT   Troponin I (High Sensitivity)     Status: None   Collection Time: 07/28/20 12:22 AM  Result Value Ref Range   Troponin I (High Sensitivity) 3 <18 ng/L   ____________________________________________  EKG My review and personal interpretation at Time: 22:58   Indication: palpitations  Rate: 80  Rhythm: sinus Axis: normal Other: normal intervals, no stemi ____________________________________________  RADIOLOGY  I personally reviewed  all radiographic images ordered to evaluate for the above acute complaints and reviewed radiology reports and findings.  These findings were personally discussed with the patient.  Please see medical record for radiology report.  ____________________________________________   PROCEDURES  Procedure(s) performed:  Procedures    Critical Care performed: no ____________________________________________   INITIAL IMPRESSION / ASSESSMENT AND PLAN / ED COURSE  Pertinent labs & imaging results that were available during my care of the patient were reviewed by me and considered in my medical decision making (see chart for details).   DDX: dysrhythmia, dehydration, electrolyte abn, anemia   Susan Hoover is a 20 y.o. who presents to the ED with symptoms as described above.  Patient clinically very well-appearing normotensive.  Blood work will be sent for the above differential.  Her exam is reassuring.  We will give some IV fluids as clinically sounds like she got a little bit dehydrated may be as little orthostatic.  EKG shows no dysrhythmia.  She not complain any chest pain or shortness of breath.  Clinical Course as of 07/28/20 0141  Wed Jul 28, 2020  0138 Patient reassessed.  Feels much improved.  Splendidly she stable and appropriate for outpatient follow-up.  Will put on antibiotics and give her dose of Keflex here  given urinalysis result.  Discussed increase p.o. fluid intake and follow-up with OB/GYN and PCP.  Patient agreeable to plan. [PR]    Clinical Course User Index [PR] Willy Eddy, MD    The patient was evaluated in Emergency Department today for the symptoms described in the history of present illness. He/she was evaluated in the context of the global COVID-19 pandemic, which necessitated consideration that the patient might be at risk for infection with the SARS-CoV-2 virus that causes COVID-19. Institutional protocols and algorithms that pertain to the evaluation of patients at risk for COVID-19 are in a state of rapid change based on information released by regulatory bodies including the CDC and federal and state organizations. These policies and algorithms were followed during the patient's care in the ED.  As part of my medical decision making, I reviewed the following data within the electronic MEDICAL RECORD NUMBER Nursing notes reviewed and incorporated, Labs reviewed, notes from prior ED visits and [place] Controlled Substance Database   ____________________________________________   FINAL CLINICAL IMPRESSION(S) / ED DIAGNOSES  Final diagnoses:  Palpitations  Dizziness      NEW MEDICATIONS STARTED DURING THIS VISIT:  New Prescriptions   CEPHALEXIN (KEFLEX) 500 MG CAPSULE    Take 1 capsule (500 mg total) by mouth 3 (three) times daily for 7 days.     Note:  This document was prepared using Dragon voice recognition software and may include unintentional dictation errors.    Willy Eddy, MD 07/28/20 [phone]

## 2020-07-28 LAB — URINALYSIS, COMPLETE (UACMP) WITH MICROSCOPIC
Bilirubin Urine: NEGATIVE
Glucose, UA: NEGATIVE mg/dL
Hgb urine dipstick: NEGATIVE
Ketones, ur: NEGATIVE mg/dL
Nitrite: NEGATIVE
Protein, ur: NEGATIVE mg/dL
Specific Gravity, Urine: 1.012 (ref 1.005–1.030)
pH: 8 (ref 5.0–8.0)

## 2020-07-28 LAB — TROPONIN I (HIGH SENSITIVITY): Troponin I (High Sensitivity): 3 ng/L (ref <18)

## 2020-07-28 MED ORDER — CEPHALEXIN 500 MG PO CAPS: 500.0000 mg | ORAL_CAPSULE | Freq: Three times a day (TID) | ORAL | 0 refills | Status: AC

## 2020-07-28 MED ORDER — CEPHALEXIN 500 MG PO CAPS
500.0000 mg | ORAL_CAPSULE | Freq: Once | ORAL | Status: AC
  Administered 2020-07-28: 500 mg via ORAL
  Filled 2020-07-28: qty 1

## 2020-08-11 ENCOUNTER — Other Ambulatory Visit: Payer: Self-pay

## 2020-08-11 ENCOUNTER — Encounter: Payer: Self-pay | Admitting: Obstetrics and Gynecology

## 2020-08-11 ENCOUNTER — Other Ambulatory Visit: Payer: Medicaid Other

## 2020-08-11 ENCOUNTER — Ambulatory Visit
Admission: RE | Admit: 2020-08-11 | Discharge: 2020-08-11 | Disposition: A | Discharge status: Home / Self Care | Payer: Medicaid Other | Source: Ambulatory Visit | Attending: Obstetrics and Gynecology | Admitting: Obstetrics and Gynecology

## 2020-08-11 ENCOUNTER — Ambulatory Visit (INDEPENDENT_AMBULATORY_CARE_PROVIDER_SITE_OTHER): Payer: Medicaid Other | Admitting: Obstetrics and Gynecology

## 2020-08-11 VITALS — BP 102/68 | HR 89 | Ht 63.0 in | Wt 154.4 lb

## 2020-08-11 DIAGNOSIS — Z8759 Personal history of other complications of pregnancy, childbirth and the puerperium: Secondary | ICD-10-CM

## 2020-08-11 DIAGNOSIS — Z3A25 25 weeks gestation of pregnancy: Secondary | ICD-10-CM | POA: Diagnosis not present

## 2020-08-11 DIAGNOSIS — Z362 Encounter for other antenatal screening follow-up: Secondary | ICD-10-CM | POA: Insufficient documentation

## 2020-08-11 DIAGNOSIS — Z3482 Encounter for supervision of other normal pregnancy, second trimester: Secondary | ICD-10-CM

## 2020-08-11 DIAGNOSIS — Z3A24 24 weeks gestation of pregnancy: Secondary | ICD-10-CM

## 2020-08-11 DIAGNOSIS — Z87440 Personal history of urinary (tract) infections: Secondary | ICD-10-CM

## 2020-08-11 LAB — POCT URINALYSIS DIPSTICK OB
Bilirubin, UA: NEGATIVE
Blood, UA: NEGATIVE
Glucose, UA: NEGATIVE
Ketones, UA: NEGATIVE
Leukocytes, UA: NEGATIVE
Nitrite, UA: NEGATIVE
Spec Grav, UA: 1.015 (ref 1.010–1.025)
Urobilinogen, UA: 0.2 E.U./dL
pH, UA: 7 (ref 5.0–8.0)

## 2020-08-11 NOTE — Progress Notes (Signed)
OB-Pt present for routine prenatal care. Pt stated having thick white discharge, lower abd pain/pressure, swollen feet, cramping and braxton hick contractions at night.

## 2020-08-11 NOTE — Progress Notes (Signed)
ROB: Notes some Braxton Hicks and pelvic pressure. Also noting a thicker discharge but is not bothersome. Had normal anatomy scan today.  Desires to breastfeed. Had latching issues with last pregnancy. Can consult with lactation inpatient. Desires natural childbirth. Is taking macrobid as prescribed. For monthly UCx today. RTC in 4 weeks, for 28 week labs at that time.    The following were addressed during this visit:  Breastfeeding Education - Early initiation of breastfeeding    Comments: Keeps milk supply adequate, helps contract uterus and slow bleeding, and early milk is the perfect first food and is easy to digest.   - The importance of exclusive breastfeeding    Comments: Provides antibodies, Lower risk of breast and ovarian cancers, and type-2 diabetes,Helps your body recover, Reduced chance of SIDS.   - Risks of giving your baby anything other than breast milk if you are breastfeeding    Comments: Make the baby less content with breastfeeds, may make my baby more susceptible to illness, and may reduce my milk supply.   - Nonpharmacological pain relief methods for labor    Comments: Deep breathing, focusing on pleasant things, movement and walking, heating pads or cold compress, massage and relaxation, continuous support from someone you trust, and Doulas   - The importance of early skin-to-skin contact    Comments: Keeps baby warm and secure, helps keep baby's blood sugar up and breathing steady, easier to bond and breastfeed, and helps calm baby.  - Rooming-in on a 24-hour basis    Comments: Easier to learn baby's feeding cues, easier to bond and get to know each other, and encourages milk production.   - Feeding on demand or baby-led feeding    Comments: Helps prevent breastfeeding complications, helps bring in good milk supply, prevents under or overfeeding, and helps baby feel content and satisfied   - Frequent feeding to help assure optimal milk production     Comments: Making a full supply of milk requires frequent removal of milk from breasts, infant will eat 8-12 times in 24 hours, if separated from infant use breast massage, hand expression and/ or pumping to remove milk from breasts.   - Effective positioning and attachment    Comments: Helps my baby to get enough breast milk, helps to produce an adequate milk supply, and helps prevent nipple pain and damage   - Exclusive breastfeeding for the first 6 months    Comments: Builds a healthy milk supply and keeps it up, protects baby from sickness and disease, and breastmilk has everything your baby needs for the first 6 months.  - Individualized Education    Comments: Contraindications to breastfeeding and other special medical conditions

## 2020-08-11 NOTE — Patient Instructions (Signed)

## 2020-08-12 NOTE — Addendum Note (Signed)
Addended by: Fabian November on: 08/12/2020 12:34 PM   Modules accepted: Orders

## 2020-08-15 LAB — URINE CULTURE

## 2020-08-16 MED ORDER — CEPHALEXIN 500 MG PO CAPS: 500.0000 mg | ORAL_CAPSULE | Freq: Three times a day (TID) | ORAL | 0 refills | Status: DC

## 2020-08-16 NOTE — Addendum Note (Signed)
Addended by: Fabian November on: 08/16/2020 11:52 AM   Modules accepted: Orders

## 2020-09-08 ENCOUNTER — Other Ambulatory Visit: Payer: Medicaid Other

## 2020-09-08 ENCOUNTER — Encounter: Payer: Medicaid Other | Admitting: Obstetrics and Gynecology

## 2020-09-08 DIAGNOSIS — Z3403 Encounter for supervision of normal first pregnancy, third trimester: Secondary | ICD-10-CM

## 2020-09-08 DIAGNOSIS — Z23 Encounter for immunization: Secondary | ICD-10-CM

## 2020-09-08 DIAGNOSIS — Z3A28 28 weeks gestation of pregnancy: Secondary | ICD-10-CM

## 2020-09-09 ENCOUNTER — Ambulatory Visit: Admission: RE | Admit: 2020-09-09 | Payer: Medicaid Other | Source: Ambulatory Visit

## 2020-09-22 ENCOUNTER — Encounter: Payer: Medicaid Other | Admitting: Obstetrics and Gynecology

## 2020-09-22 ENCOUNTER — Encounter: Payer: Self-pay | Admitting: Obstetrics and Gynecology

## 2020-09-23 ENCOUNTER — Other Ambulatory Visit: Payer: Self-pay

## 2020-09-23 ENCOUNTER — Ambulatory Visit (INDEPENDENT_AMBULATORY_CARE_PROVIDER_SITE_OTHER): Payer: Medicaid Other | Admitting: Obstetrics and Gynecology

## 2020-09-23 ENCOUNTER — Other Ambulatory Visit: Payer: Medicaid Other

## 2020-09-23 ENCOUNTER — Encounter: Payer: Self-pay | Admitting: Obstetrics and Gynecology

## 2020-09-23 VITALS — BP 106/69 | HR 84 | Wt 164.4 lb

## 2020-09-23 DIAGNOSIS — Z3403 Encounter for supervision of normal first pregnancy, third trimester: Secondary | ICD-10-CM | POA: Diagnosis not present

## 2020-09-23 DIAGNOSIS — N3000 Acute cystitis without hematuria: Secondary | ICD-10-CM

## 2020-09-23 DIAGNOSIS — Z23 Encounter for immunization: Secondary | ICD-10-CM

## 2020-09-23 DIAGNOSIS — Z3A30 30 weeks gestation of pregnancy: Secondary | ICD-10-CM | POA: Diagnosis not present

## 2020-09-23 LAB — POCT URINALYSIS DIPSTICK OB
Bilirubin, UA: NEGATIVE
Blood, UA: NEGATIVE
Glucose, UA: NEGATIVE
Ketones, UA: NEGATIVE
Leukocytes, UA: NEGATIVE
Nitrite, UA: POSITIVE
Spec Grav, UA: 1.015 (ref 1.010–1.025)
Urobilinogen, UA: 0.2 E.U./dL
pH, UA: 6.5 (ref 5.0–8.0)

## 2020-09-23 MED ORDER — SULFAMETHOXAZOLE-TRIMETHOPRIM 800-160 MG PO TABS: 1.0000 | ORAL_TABLET | Freq: Two times a day (BID) | ORAL | 1 refills | Status: DC

## 2020-09-23 MED ORDER — SULFAMETHOXAZOLE-TRIMETHOPRIM 800-160 MG PO TABS: 1.0000 | ORAL_TABLET | Freq: Every day | ORAL | 1 refills | Status: DC

## 2020-09-23 NOTE — Progress Notes (Signed)
ROB: Patient states she stopped using her Macrobid because it caused bumps on her vagina.  She reports that since stopping the Macrobid the bumps have not resolved.  She says they "itch a little."  Discussed use of Macrobid.  Patient now found to have positive nitrites in her urine -significant for UTI.  I have reinforced the discussion regarding use of antibiotics on a daily basis to prevent urinary tract infection and subsequent hospital admission for Pyelo. Because patient does not want to take Macrobid have changed her to Bactrim DS.  Urine C&S sent.  Advised patient she cannot take Bactrim DS after 37 weeks of pregnancy. 1 hour GCT today.

## 2020-09-24 ENCOUNTER — Encounter: Payer: Self-pay | Admitting: Obstetrics and Gynecology

## 2020-09-24 LAB — CBC
Hematocrit: 28.7 % — ABNORMAL LOW (ref 34.0–46.6)
Hemoglobin: 9.3 g/dL — ABNORMAL LOW (ref 11.1–15.9)
MCH: 25.8 pg — ABNORMAL LOW (ref 26.6–33.0)
MCHC: 32.4 g/dL (ref 31.5–35.7)
MCV: 80 fL (ref 79–97)
Platelets: 175 10*3/uL (ref 150–450)
RBC: 3.61 x10E6/uL — ABNORMAL LOW (ref 3.77–5.28)
RDW: 14.9 % (ref 11.7–15.4)
WBC: 10.2 10*3/uL (ref 3.4–10.8)

## 2020-09-24 LAB — RPR: RPR Ser Ql: NONREACTIVE

## 2020-09-24 LAB — GLUCOSE, 1 HOUR GESTATIONAL: Gestational Diabetes Screen: 143 mg/dL — ABNORMAL HIGH (ref 65–139)

## 2020-09-28 LAB — URINE CULTURE

## 2020-10-05 ENCOUNTER — Other Ambulatory Visit: Payer: Medicaid Other

## 2020-10-06 ENCOUNTER — Ambulatory Visit
Admission: RE | Admit: 2020-10-06 | Discharge: 2020-10-06 | Disposition: A | Discharge status: Home / Self Care | Payer: Medicaid Other | Source: Ambulatory Visit | Attending: Obstetrics and Gynecology | Admitting: Obstetrics and Gynecology

## 2020-10-06 ENCOUNTER — Other Ambulatory Visit: Payer: Self-pay

## 2020-10-06 DIAGNOSIS — Z362 Encounter for other antenatal screening follow-up: Secondary | ICD-10-CM | POA: Insufficient documentation

## 2020-10-06 DIAGNOSIS — Z3A32 32 weeks gestation of pregnancy: Secondary | ICD-10-CM | POA: Diagnosis not present

## 2020-10-06 DIAGNOSIS — Z3493 Encounter for supervision of normal pregnancy, unspecified, third trimester: Secondary | ICD-10-CM | POA: Diagnosis not present

## 2020-10-06 DIAGNOSIS — Z3482 Encounter for supervision of other normal pregnancy, second trimester: Secondary | ICD-10-CM | POA: Diagnosis not present

## 2020-10-07 ENCOUNTER — Encounter: Payer: Self-pay | Admitting: Obstetrics and Gynecology

## 2020-10-07 ENCOUNTER — Ambulatory Visit (INDEPENDENT_AMBULATORY_CARE_PROVIDER_SITE_OTHER): Payer: Medicaid Other | Admitting: Obstetrics and Gynecology

## 2020-10-07 ENCOUNTER — Other Ambulatory Visit: Payer: Self-pay | Admitting: Obstetrics and Gynecology

## 2020-10-07 VITALS — BP 115/75 | HR 91 | Wt 166.5 lb

## 2020-10-07 DIAGNOSIS — Z3483 Encounter for supervision of other normal pregnancy, third trimester: Secondary | ICD-10-CM

## 2020-10-07 DIAGNOSIS — R7309 Other abnormal glucose: Secondary | ICD-10-CM

## 2020-10-07 DIAGNOSIS — O2343 Unspecified infection of urinary tract in pregnancy, third trimester: Secondary | ICD-10-CM

## 2020-10-07 DIAGNOSIS — Z3A32 32 weeks gestation of pregnancy: Secondary | ICD-10-CM

## 2020-10-07 LAB — POCT URINALYSIS DIPSTICK OB
Bilirubin, UA: NEGATIVE
Blood, UA: NEGATIVE
Glucose, UA: NEGATIVE
Ketones, UA: NEGATIVE
Nitrite, UA: NEGATIVE
POC,PROTEIN,UA: NEGATIVE
Spec Grav, UA: 1.01 (ref 1.010–1.025)
Urobilinogen, UA: 0.2 E.U./dL
pH, UA: 6.5 (ref 5.0–8.0)

## 2020-10-07 NOTE — Progress Notes (Signed)
OB-Pt present for routine prenatal care. Pt stated having contractions at night, no vaginal bleeding, increase in vaginal discharge, vaginal pressure and pain; fetal movement present.

## 2020-10-07 NOTE — Progress Notes (Signed)
ROB: Reports contractions (mostly at night), likely [facility]. Also notes increase in discharge but not bothersome. Had recent UCx positive again for E. Coli, is currently taking Bactrim DS. Declines circumcision for female infant. Had abnormal 1 hr GCT, needs to be scheduled for 3 hr GTT.  RTC in 2 weeks.

## 2020-10-07 NOTE — Patient Instructions (Signed)
GESTATIONAL DIABETES TESTING FOR PREGNANCY  Pregnant women can develop a condition known as Gestational Diabetes (diabetes brought on by pregnancy) which can pose a risk to both mother and baby. A glucose tolerance test is a common type of testing for potential gestational diabetes.  There are several tests intended to identify gestational diabetes in pregnant women. The first, called the Glucose Challenge Screening, is a preliminary screening test performed between 26-28 weeks. If a woman tests positive during this screening test, the second test, called the Glucose Tolerance Test, may be performed. This test will diagnose whether diabetes exists or not by indicating whether or not the body is using glucose (a type of sugar) effectively.  The Glucose Challenge Screening is now considered to be a standard test performed during the early part of the third trimester of pregnancy.  What is the Glucose Challenge Screening Test? No preparation is required prior to the test. During the test, the mother is asked to drink a sweet liquid (glucose) and then will have blood drawn one hour from having the drink, as blood glucose levels normally peak within one hour. No fasting is required prior to this test.  The test evaluates how your body processes sugar. A high level in your blood may indicate your body is not processing sugar effectively (positive test). If the results of this screen are positive, the woman may have the Glucose Tolerance Test performed. It is important to note that not all women who test positive for the Glucose Challenge Screening test are found to have diabetes upon further diagnosis.  What is the Glucose Tolerance Test? Prior to the taking the glucose tolerance test, your doctor will ask you to make sure and eat at least 150mg of carbohydrates (about what you will get from a slice or two of bread) for three days prior to the time you will be asked to fast. You will not be permitted to eat  or drink anything but sips of water for 14 hours prior to the test, so it is best to schedule the test for first thing in the morning.  Additionally, you should plan to have someone drive you to and from the test since your energy levels may be low and there is a slight possibility you may feel light-headed.  When you arrive, the technician will draw blood to measure your baseline "fasting blood glucose level". You will be asked to drink a larger volume (or more concentrated solution) of the glucose drink than was used in the initial Glucose Challenge Screening test. Your blood will be drawn and tested every hour for the next three hours.  The following are the values that the American Diabetes Association considers to be abnormal during the Glucose Tolerance Test:  Interval Abnormal reading Fasting 95 mg/dl or higher One hour 180 mg/dl or higher Two hours 155 mg/dl or higher Three hours 140 mg/dl or higher  What if my Glucose Tolerance Test Results are Abnormal? If only one of your readings comes back abnormal, your doctor may suggest some changes to your diet and/or test you again later in the pregnancy. If two or more of your readings come back abnormal, you'll be diagnosed with Gestational Diabetes and your doctor or midwife will talk to you about a treatment plan. Treating diabetes during pregnancy is extremely important to protect the health of both mother and baby.   Compiled using information from the following sources:  1. American Diabetes Association  https://www.diabetes.org  2. E[employer]  https://www.emedicine.com    3. National Institute of Diabetes and Digestive and Kidney Diseases  

## 2020-10-11 NOTE — Addendum Note (Signed)
Addended by: Silvano Bilis on: 10/11/2020 03:08 PM   Modules accepted: Orders

## 2020-10-12 ENCOUNTER — Other Ambulatory Visit: Payer: Medicaid Other

## 2020-10-20 ENCOUNTER — Encounter: Payer: Medicaid Other | Admitting: Obstetrics and Gynecology

## 2020-10-20 ENCOUNTER — Other Ambulatory Visit: Payer: Self-pay

## 2020-11-01 DIAGNOSIS — F4320 Adjustment disorder, unspecified: Secondary | ICD-10-CM | POA: Diagnosis not present

## 2020-11-05 ENCOUNTER — Encounter: Payer: Medicaid Other | Admitting: Obstetrics and Gynecology

## 2020-11-17 ENCOUNTER — Encounter: Payer: Medicaid Other | Admitting: Obstetrics and Gynecology

## 2020-11-17 ENCOUNTER — Inpatient Hospital Stay (HOSPITAL_COMMUNITY)
Admission: AD | Admit: 2020-11-17 | Discharge: 2020-11-17 | Disposition: A | Discharge status: Home / Self Care | Payer: Medicaid Other | Attending: Obstetrics & Gynecology | Admitting: Obstetrics & Gynecology

## 2020-11-17 ENCOUNTER — Other Ambulatory Visit: Payer: Self-pay

## 2020-11-17 ENCOUNTER — Encounter (HOSPITAL_COMMUNITY): Payer: Self-pay | Admitting: Obstetrics & Gynecology

## 2020-11-17 DIAGNOSIS — Z3689 Encounter for other specified antenatal screening: Secondary | ICD-10-CM | POA: Insufficient documentation

## 2020-11-17 DIAGNOSIS — N898 Other specified noninflammatory disorders of vagina: Secondary | ICD-10-CM

## 2020-11-17 DIAGNOSIS — Z79899 Other long term (current) drug therapy: Secondary | ICD-10-CM | POA: Insufficient documentation

## 2020-11-17 DIAGNOSIS — Z3A38 38 weeks gestation of pregnancy: Secondary | ICD-10-CM | POA: Diagnosis not present

## 2020-11-17 DIAGNOSIS — O26893 Other specified pregnancy related conditions, third trimester: Secondary | ICD-10-CM | POA: Insufficient documentation

## 2020-11-17 LAB — POCT FERN TEST: POCT Fern Test: NEGATIVE

## 2020-11-17 LAB — WET PREP, GENITAL
Clue Cells Wet Prep HPF POC: NONE SEEN
Sperm: NONE SEEN
Trich, Wet Prep: NONE SEEN
Yeast Wet Prep HPF POC: NONE SEEN

## 2020-11-17 NOTE — MAU Note (Signed)
Presents with c/o irregular ctxs and LOF for 2 weeks, states increased vaginal discharge.  Denies VB.  Endorses +FM.

## 2020-11-17 NOTE — MAU Provider Note (Signed)
History   [id number]   Chief Complaint  Patient presents with   Contractions   Rupture of Membranes    HPI [facility] Cal[place] Schader is a 20 y.o. female  G2P1001 @38 .3 wks here with report of leaking clear fluid for 2 days. She did not have a gush.   Pt reports no contractions. She denies vaginal bleeding. Last intercourse was not recent. She reports + fetal movement. All other systems negative.    Patient's last menstrual period was 02/15/2020 (exact date).  OB History  Gravida Para Term Preterm AB Living  2 1 1     1   SAB IAB Ectopic Multiple Live Births        0 1    # Outcome Date GA Lbr Len/2nd Weight Sex Delivery Anes PTL Lv  2 Current           1 Term 12/29/17 [redacted]w[redacted]d / 02:00 3730 g F Vag-Vacuum Local  LIV     Birth Comments: N/A    Past Medical History:  Diagnosis Date   Anemia     History reviewed. No pertinent family history.  Social History   Socioeconomic History   Marital status: Single    Spouse name: Not on file   Number of children: Not on file   Years of education: Not on file   Highest education level: Not on file  Occupational History   Not on file  Tobacco Use   Smoking status: Never   Smokeless tobacco: Never  Vaping Use   Vaping Use: Never used  Substance and Sexual Activity   Alcohol use: No   Drug use: No   Sexual activity: Yes  Other Topics Concern   Not on file  Social History Narrative   Not on file   Social Determinants of Health   Financial Resource Strain: Not on file  Food Insecurity: Not on file  Transportation Needs: Not on file  Physical Activity: Not on file  Stress: Not on file  Social Connections: Not on file    No Known Allergies  No current facility-administered medications on file prior to encounter.   Current Outpatient Medications on File Prior to Encounter  Medication Sig Dispense Refill   cephALEXin (KEFLEX) 500 MG capsule Take 1 capsule (500 mg total) by mouth 3 (three) times daily. 21  capsule 0   ondansetron (ZOFRAN) 4 MG tablet Take 1 tablet (4 mg total) by mouth every 8 (eight) hours as needed for nausea or vomiting. 30 tablet 1   Prenatal Vit-Fe Fumarate-FA (MULTIVITAMIN-PRENATAL) 27-0.8 MG TABS tablet Take 1 tablet by mouth daily at 12 noon. 90 tablet 3   sulfamethoxazole-trimethoprim (BACTRIM DS) 800-160 MG tablet Take 1 tablet by mouth 2 (two) times daily. 14 tablet 1     Review of Systems  Gastrointestinal:  Negative for abdominal pain.  Genitourinary:  Positive for vaginal discharge. Negative for vaginal bleeding.   Physical Exam   Vitals:   11/17/20 2000 11/17/20 2005 11/17/20 2010 11/17/20 2015  BP:    109/63  Pulse:    92  Resp:      Temp:      TempSrc:      SpO2: 100% 100% 100% 100%  Weight:      Height:       Physical Exam Nursing note reviewed. Exam conducted with a chaperone present.  Constitutional:      General: She is not in acute distress.    Appearance: Normal appearance.  HENT:  Head: Normocephalic and atraumatic.  Pulmonary:     Effort: Pulmonary effort is normal. No respiratory distress.  Genitourinary:    Comments: SSE: no pool, fern neg Musculoskeletal:        General: Normal range of motion.     Cervical back: Normal range of motion.  Skin:    General: Skin is warm and dry.  Neurological:     General: No focal deficit present.     Mental Status: She is alert and oriented to person, place, and time.  Psychiatric:        Mood and Affect: Mood normal.        Behavior: Behavior normal.  EFM: 140 bpm, mod variability, + accels, no decels Toco: rare  Results for orders placed or performed during the hospital encounter of 11/17/20 (from the past 24 hour(s))  Wet prep, genital     Status: Abnormal   Collection Time: 11/17/20  7:45 PM  Result Value Ref Range   Yeast Wet Prep HPF POC NONE SEEN NONE SEEN   Trich, Wet Prep NONE SEEN NONE SEEN   Clue Cells Wet Prep HPF POC NONE SEEN NONE SEEN   WBC, Wet Prep HPF POC MANY (A)  NONE SEEN   Sperm NONE SEEN   POCT fern test     Status: Normal   Collection Time: 11/17/20  7:49 PM  Result Value Ref Range   POCT Fern Test Negative = intact amniotic membranes    MAU Course  Procedures  MDM Labs ordered and reviewed. No signs of SROM. Stable for discharge home.   Assessment and Plan   1. [redacted] weeks gestation of pregnancy   2. NST (non-stress test) reactive   3. Vaginal discharge during pregnancy in third trimester    Discharge home Follow up at [facility] as scheduled Labor precautions  Allergies as of 11/17/2020   No Known Allergies      Medication List     STOP taking these medications    cephALEXin 500 MG capsule Commonly known as: KEFLEX   sulfamethoxazole-trimethoprim 800-160 MG tablet Commonly known as: BACTRIM DS       TAKE these medications    multivitamin-prenatal 27-0.8 MG Tabs tablet Take 1 tablet by mouth daily at 12 noon.   ondansetron 4 MG tablet Commonly known as: Zofran Take 1 tablet (4 mg total) by mouth every 8 (eight) hours as needed for nausea or vomiting.       Donette Larry, CNM 11/17/2020 8:20 PM

## 2020-11-21 ENCOUNTER — Observation Stay
Admission: EM | Admit: 2020-11-21 | Discharge: 2020-11-21 | Disposition: A | Discharge status: Home / Self Care | Payer: Medicaid Other | Attending: Obstetrics and Gynecology | Admitting: Obstetrics and Gynecology

## 2020-11-21 DIAGNOSIS — Z3A39 39 weeks gestation of pregnancy: Secondary | ICD-10-CM

## 2020-11-21 DIAGNOSIS — O368331 Maternal care for abnormalities of the fetal heart rate or rhythm, third trimester, fetus 1: Secondary | ICD-10-CM | POA: Insufficient documentation

## 2020-11-21 DIAGNOSIS — O471 False labor at or after 37 completed weeks of gestation: Principal | ICD-10-CM | POA: Insufficient documentation

## 2020-11-21 NOTE — OB Triage Note (Signed)
Patient to traige for increased UC's. Reports positive fetal movement, no LOF, no bleeding,

## 2020-11-23 ENCOUNTER — Ambulatory Visit (INDEPENDENT_AMBULATORY_CARE_PROVIDER_SITE_OTHER): Payer: Medicaid Other | Admitting: Obstetrics and Gynecology

## 2020-11-23 ENCOUNTER — Other Ambulatory Visit: Payer: Self-pay

## 2020-11-23 ENCOUNTER — Encounter: Payer: Self-pay | Admitting: Obstetrics and Gynecology

## 2020-11-23 VITALS — BP 108/73 | HR 81 | Wt 175.2 lb

## 2020-11-23 DIAGNOSIS — Z3A39 39 weeks gestation of pregnancy: Secondary | ICD-10-CM

## 2020-11-23 DIAGNOSIS — Z3483 Encounter for supervision of other normal pregnancy, third trimester: Secondary | ICD-10-CM

## 2020-11-23 LAB — POCT URINALYSIS DIPSTICK OB
Bilirubin, UA: NEGATIVE
Blood, UA: NEGATIVE
Glucose, UA: NEGATIVE
Ketones, UA: NEGATIVE
Leukocytes, UA: NEGATIVE
Nitrite, UA: NEGATIVE
Spec Grav, UA: 1.01 (ref 1.010–1.025)
Urobilinogen, UA: 0.2 E.U./dL
pH, UA: 6.5 (ref 5.0–8.0)

## 2020-11-23 NOTE — Discharge Summary (Signed)
    L&D OB Triage Note  SUBJECTIVE [facility] Susan Hoover is a 20 y.o. G14P1001 female at [redacted]w[redacted]d, EDD Estimated Date of Delivery: 11/28/20 who presented to triage with complaints of an increase in uterine contractions.  She denies bleeding or leakage of fluid.  OB History  Gravida Para Term Preterm AB Living  2 1 1  0 0 1  SAB IAB Ectopic Multiple Live Births  0 0 0 0 1    # Outcome Date GA Lbr Len/2nd Weight Sex Delivery Anes PTL Lv  2 Current           1 Term 12/29/17 [redacted]w[redacted]d / 02:00 3730 g F Vag-Vacuum Local  LIV     Birth Comments: N/A     Name: VAZQUEZ CORRAL,GIRL Fayrene     Apgar1: 8  Apgar5: 9    No medications prior to admission.     OBJECTIVE  Nursing Evaluation:   BP 124/71   Pulse 87   Temp 98.2 F (36.8 C) (Oral)   LMP 02/15/2020 (Exact Date)    Findings:        Patient not in labor.  Rare contractions.      NST was performed and has been reviewed by me.  NST INTERPRETATION: Category I  Mode: External Baseline Rate (A): 130 bpm Variability: Moderate Accelerations: 15 x 15 Decelerations: None     Contraction Frequency (min): x1 rare contractions.  ASSESSMENT Impression:  1.  Pregnancy:  G2P1001 at [redacted]w[redacted]d , EDD Estimated Date of Delivery: 11/28/20 2.  Reassuring fetal and maternal status 3.  Patient not in labor.  PLAN 1. Discussed current condition and above findings with patient and reassurance given.  All questions answered. 2. Discharge home with standard labor precautions given to return to L&D or call the office for problems. 3. Continue routine prenatal care.

## 2020-11-23 NOTE — Progress Notes (Signed)
ROB: Patient reports daily fetal movement.  Complains of occasional irregular contractions.  Has not been seen in the office since May.  2 visits to the hospital for contractions.  We have discussed delivery at [facility].  Plan follow-up in 1 week for NST.  Signs and symptoms of labor discussed.

## 2020-11-25 ENCOUNTER — Encounter: Payer: Self-pay | Admitting: Obstetrics and Gynecology

## 2020-11-28 ENCOUNTER — Inpatient Hospital Stay: Admit: 2020-11-28 | Payer: Self-pay

## 2020-12-01 ENCOUNTER — Ambulatory Visit (INDEPENDENT_AMBULATORY_CARE_PROVIDER_SITE_OTHER): Payer: Medicaid Other | Admitting: Obstetrics and Gynecology

## 2020-12-01 ENCOUNTER — Other Ambulatory Visit: Payer: Self-pay

## 2020-12-01 ENCOUNTER — Other Ambulatory Visit: Payer: Medicaid Other

## 2020-12-01 ENCOUNTER — Encounter: Payer: Self-pay | Admitting: Obstetrics and Gynecology

## 2020-12-01 VITALS — BP 108/73 | HR 98 | Wt 179.1 lb

## 2020-12-01 DIAGNOSIS — O480 Post-term pregnancy: Secondary | ICD-10-CM | POA: Diagnosis not present

## 2020-12-01 DIAGNOSIS — Z3483 Encounter for supervision of other normal pregnancy, third trimester: Secondary | ICD-10-CM

## 2020-12-01 DIAGNOSIS — Z3A40 40 weeks gestation of pregnancy: Secondary | ICD-10-CM

## 2020-12-01 DIAGNOSIS — O0933 Supervision of pregnancy with insufficient antenatal care, third trimester: Secondary | ICD-10-CM

## 2020-12-01 LAB — POCT URINALYSIS DIPSTICK OB
Bilirubin, UA: NEGATIVE
Blood, UA: NEGATIVE
Glucose, UA: NEGATIVE
Ketones, UA: NEGATIVE
Leukocytes, UA: NEGATIVE
Nitrite, UA: NEGATIVE
POC,PROTEIN,UA: NEGATIVE
Spec Grav, UA: 1.015 (ref 1.010–1.025)
Urobilinogen, UA: 0.2 E.U./dL
pH, UA: 6 (ref 5.0–8.0)

## 2020-12-01 NOTE — Addendum Note (Signed)
Addended by: Fabian November on: 12/01/2020 05:23 PM   Modules accepted: Orders

## 2020-12-01 NOTE — Progress Notes (Signed)
ROB: Patient notes mild cramping, denies regular contractions. Discussed IOL by 41 weeks if no delivery. Scheduled for 7/18 at midnight.  Membrane sweeping performed today.  NST performed today was reviewed and was found to be reactive.  Continue recommended antenatal testing and prenatal care.   NONSTRESS TEST INTERPRETATION  INDICATIONS:  postdates pregnancy  FHR baseline: 140 bpm RESULTS:Reactive COMMENTS: Uterine irritability with irregular contractions.   PLAN: 1. Continue fetal kick counts twice a day. 2. Scheduled for IOL at on 7/18.

## 2020-12-01 NOTE — Patient Instructions (Signed)
COVID 19 Instructions for Scheduled Procedure (Inductions/C-sections and GYN surgeries)   Thank you for choosing [facility] for your services.  You have been scheduled for a procedure called __________Induction of Labor___________________.    Your procedure is scheduled on ______________7/18/2022 at midnight_________________.  You are required to have COVID-19 testing performed 2 days prior to your scheduled procedure date.  Testing is performed between 9 AM and 2 PM Monday through Friday.  Please present for testing on ___7/15/2022______________ during this hour. Drive up testing is performed in the [facility] (this is next to the [employer]).    Upon your scheduled procedure date, you will need to arrive at the [facility] entrance. (There is a statue at the front of this entrance.)   Please arrive on time if you are scheduled for an induction of labor.   If you are scheduled for a Cesarean delivery or for Gyn Surgery, arrive 2 hours prior to your procedure time.   If you are an Obstetric patient and your arrival time falls between 11 PM and 6 AM call L&D [phone]) when you arrive.  A staff member will meet you at the [facility] entrance.  At this time, patients are allowed 1 support person to accompany them. Face masks are required for you and your support person. Your support person is now allowed to be there with you during the entire time of your admission.   Please contact the office if you have any questions regarding this information.  The [facility] office number is [phone].     Thank you,    Your [facility] Providers    Labor Induction Labor induction is when steps are taken to cause a pregnant woman to begin the labor process. Most women go into labor on their own between 37 weeks and 42 weeks of pregnancy. When this does not happen, or when there is a medical needfor labor to begin, steps may be taken to induce, or bring on,  labor. Labor induction causes a pregnant woman's uterus to contract. It also causes the cervix to soften (ripen), open (dilate), and thin out. Usually, labor is not induced before 39 weeks of pregnancyunless there is a medical reason to do so. When is labor induction considered? Labor induction may be right for you if: Your pregnancy lasts longer than 41 to 42 weeks. Your placenta is separating from your uterus (placental abruption). You have a rupture of membranes and your labor does not begin. You have health problems, like diabetes or high blood pressure (preeclampsia) during your pregnancy. Your baby has stopped growing or does not have enough amniotic fluid. Before labor induction begins, your health care provider will consider the following factors: Your medical condition and the baby's condition. How many weeks you have been pregnant. How mature the baby's lungs are. The condition of your cervix. The position of the baby. The size of your birth canal. Tell a health care provider about: Any allergies you have. All medicines you are taking, including vitamins, herbs, eye drops, creams, and over-the-counter medicines. Any problems you or your family members have had with anesthetic medicines. Any surgeries you have had. Any blood disorders you have. Any medical conditions you have. What are the risks? Generally, this is a safe procedure. However, problems may occur, including: Failed induction. Changes in fetal heart rate, such as being too high, too low, or irregular (erratic). Infection in the mother or the baby. Increased risk of having a cesarean  delivery. Breaking off (abruption) of the placenta from the uterus. This is rare. Rupture of the uterus. This is very rare. Your baby could fail to get enough blood flow or oxygen. This can be life-threatening. When induction is needed for medical reasons, the benefits generally outweighthe risks. What happens during the  procedure? During the procedure, your health care provider will use one of these methods to induce labor: Stripping the membranes. In this method, the amniotic sac tissue is gently separated from the cervix. This causes the following to happen: Your cervix stretches, which in turn causes the release of prostaglandins. Prostaglandins induce labor and cause the uterus to contract. This procedure is often done in an office visit. You will be sent home to wait for contractions to begin. Prostaglandin medicine. This medicine starts contractions and causes the cervix to dilate and ripen. This can be taken by mouth (orally) or by being inserted into the vagina (suppository). Inserting a small, thin tube (catheter) with a balloon into the vagina and then expanding the balloon with water to dilate the cervix. Breaking the water. In this method, a small instrument is used to make a small hole in the amniotic sac. This eventually causes the amniotic sac to break. Contractions should begin within a few hours. Medicine to trigger or strengthen contractions. This medicine is given through an IV that is inserted into a vein in your arm. This procedure may vary among health care providers and hospitals. Where to find more information [employer]: www.marchofdimes.org [employer]necologists: www.acog.org Summary Labor induction causes a pregnant woman's uterus to contract. It also causes the cervix to soften (ripen), open (dilate), and thin out. Labor is usually not induced before 39 weeks of pregnancy unless there is a medical reason to do so. When induction is needed for medical reasons, the benefits generally outweigh the risks. Talk with your health care provider about which methods of labor induction are right for you. This information is not intended to replace advice given to you by your health care provider. Make sure you discuss any questions you have with your  healthcare provider. Document Revised: 02/19/2020 Document Reviewed: 02/19/2020 Elsevier Patient Education  2022 [employer].

## 2020-12-01 NOTE — Progress Notes (Signed)
P OB-Pt present for routine prenatal care. Pt stated fetal movement present; braxton hick contractions present;  lower abd pain and pressure, swelling in feet/hands, no vaginal bleeding and no changes in vaginal discharge.

## 2020-12-02 ENCOUNTER — Other Ambulatory Visit: Payer: Self-pay

## 2020-12-02 ENCOUNTER — Observation Stay
Admission: EM | Admit: 2020-12-02 | Discharge: 2020-12-02 | Disposition: A | Discharge status: Home / Self Care | Payer: Medicaid Other | Attending: Obstetrics and Gynecology | Admitting: Obstetrics and Gynecology

## 2020-12-02 ENCOUNTER — Encounter: Payer: Self-pay | Admitting: Obstetrics and Gynecology

## 2020-12-02 DIAGNOSIS — O471 False labor at or after 37 completed weeks of gestation: Secondary | ICD-10-CM

## 2020-12-02 DIAGNOSIS — O26893 Other specified pregnancy related conditions, third trimester: Secondary | ICD-10-CM | POA: Diagnosis not present

## 2020-12-02 DIAGNOSIS — N898 Other specified noninflammatory disorders of vagina: Secondary | ICD-10-CM | POA: Diagnosis not present

## 2020-12-02 DIAGNOSIS — Z3A40 40 weeks gestation of pregnancy: Secondary | ICD-10-CM

## 2020-12-02 DIAGNOSIS — O479 False labor, unspecified: Secondary | ICD-10-CM | POA: Diagnosis present

## 2020-12-02 NOTE — Final Progress Note (Signed)
L&D OB Triage Note  Jeanene Mena Justin Buechner is a 20 y.o. G2P1001 female at [redacted]w[redacted]d, EDD Estimated Date of Delivery: 11/28/20 who presented to triage for complaints of mucoid discharge, possible bloody show and pelvic cramping.  She was evaluated by the nurses with no significant findings for labor. Vital signs stable. An NST was performed and has been reviewed by MD.   NST INTERPRETATION: Indications: rule out uterine contractions  Mode: External Baseline Rate (A): 130 bpm Variability: Moderate Accelerations: 15 x 15 Decelerations: None     Contraction Frequency (min): none  Impression: reactive   Plan: NST performed was reviewed and was found to be reactive. She was discharged home with bleeding/labor precautions.  Continue routine prenatal care. Follow up with OB/GYN as previously scheduled.     Hildred Laser, MD [facility]

## 2020-12-02 NOTE — OB Triage Note (Signed)
Pt is a G2P1 stated that she is having some cramping. Pt stated that she is having some LOF. Pt reports positive fetal movement. Pt reports some dark brown blood mucus type discharge. Pt stated that she has had sexual intercourse within the last 24 hours.

## 2020-12-04 ENCOUNTER — Inpatient Hospital Stay
Admission: EM | Admit: 2020-12-04 | Discharge: 2020-12-05 | DRG: 807 | Disposition: A | Discharge status: Home / Self Care | Payer: Medicaid Other | Attending: Obstetrics and Gynecology | Admitting: Obstetrics and Gynecology

## 2020-12-04 ENCOUNTER — Inpatient Hospital Stay: Payer: Medicaid Other | Admitting: Anesthesiology

## 2020-12-04 ENCOUNTER — Other Ambulatory Visit: Payer: Self-pay

## 2020-12-04 ENCOUNTER — Encounter: Payer: Self-pay | Admitting: Obstetrics and Gynecology

## 2020-12-04 DIAGNOSIS — O26893 Other specified pregnancy related conditions, third trimester: Secondary | ICD-10-CM | POA: Diagnosis not present

## 2020-12-04 DIAGNOSIS — Z3A40 40 weeks gestation of pregnancy: Secondary | ICD-10-CM | POA: Diagnosis not present

## 2020-12-04 DIAGNOSIS — O480 Post-term pregnancy: Secondary | ICD-10-CM | POA: Diagnosis not present

## 2020-12-04 DIAGNOSIS — Z20822 Contact with and (suspected) exposure to covid-19: Secondary | ICD-10-CM | POA: Diagnosis present

## 2020-12-04 LAB — CBC
HCT: 30.6 % — ABNORMAL LOW (ref 36.0–46.0)
Hemoglobin: 9.3 g/dL — ABNORMAL LOW (ref 12.0–15.0)
MCH: 23 pg — ABNORMAL LOW (ref 26.0–34.0)
MCHC: 30.4 g/dL (ref 30.0–36.0)
MCV: 75.6 fL — ABNORMAL LOW (ref 80.0–100.0)
Platelets: 179 10*3/uL (ref 150–400)
RBC: 4.05 MIL/uL (ref 3.87–5.11)
RDW: 19.1 % — ABNORMAL HIGH (ref 11.5–15.5)
WBC: 12.3 10*3/uL — ABNORMAL HIGH (ref 4.0–10.5)
nRBC: 0.2 % (ref 0.0–0.2)

## 2020-12-04 LAB — RESP PANEL BY RT-PCR (FLU A&B, COVID) ARPGX2
Influenza A by PCR: NEGATIVE
Influenza B by PCR: NEGATIVE
SARS Coronavirus 2 by RT PCR: NEGATIVE

## 2020-12-04 LAB — TYPE AND SCREEN
ABO/RH(D): A POS
Antibody Screen: NEGATIVE

## 2020-12-04 LAB — GROUP B STREP BY PCR: Group B strep by PCR: NEGATIVE

## 2020-12-04 MED ORDER — BENZOCAINE-MENTHOL 20-0.5 % EX AERO
1.0000 "application " | INHALATION_SPRAY | CUTANEOUS | Status: DC | PRN
  Administered 2020-12-04: 1 via TOPICAL
  Filled 2020-12-04: qty 56

## 2020-12-04 MED ORDER — OXYTOCIN 10 UNIT/ML IJ SOLN
INTRAMUSCULAR | Status: AC
  Filled 2020-12-04: qty 2

## 2020-12-04 MED ORDER — DIPHENHYDRAMINE HCL 50 MG/ML IJ SOLN: 12.5000 mg | INTRAMUSCULAR | Status: DC | PRN

## 2020-12-04 MED ORDER — EPHEDRINE 5 MG/ML INJ
10.0000 mg | INTRAVENOUS | Status: DC | PRN
  Filled 2020-12-04: qty 2

## 2020-12-04 MED ORDER — ZOLPIDEM TARTRATE 5 MG PO TABS: 5.0000 mg | ORAL_TABLET | Freq: Every evening | ORAL | Status: DC | PRN

## 2020-12-04 MED ORDER — OXYTOCIN-SODIUM CHLORIDE 30-0.9 UT/500ML-% IV SOLN: 2.5000 [IU]/h | INTRAVENOUS | Status: DC

## 2020-12-04 MED ORDER — PHENYLEPHRINE 40 MCG/ML (10ML) SYRINGE FOR IV PUSH (FOR BLOOD PRESSURE SUPPORT)
80.0000 ug | PREFILLED_SYRINGE | INTRAVENOUS | Status: DC | PRN
  Filled 2020-12-04: qty 10

## 2020-12-04 MED ORDER — ACETAMINOPHEN 325 MG PO TABS
650.0000 mg | ORAL_TABLET | ORAL | Status: DC | PRN
  Administered 2020-12-04 – 2020-12-05 (×2): 650 mg via ORAL
  Filled 2020-12-04: qty 2

## 2020-12-04 MED ORDER — OXYCODONE-ACETAMINOPHEN 5-325 MG PO TABS: 2.0000 | ORAL_TABLET | ORAL | Status: DC | PRN

## 2020-12-04 MED ORDER — OXYTOCIN-SODIUM CHLORIDE 30-0.9 UT/500ML-% IV SOLN
INTRAVENOUS | Status: AC
  Filled 2020-12-04: qty 1000

## 2020-12-04 MED ORDER — OXYCODONE-ACETAMINOPHEN 5-325 MG PO TABS: 1.0000 | ORAL_TABLET | ORAL | Status: DC | PRN

## 2020-12-04 MED ORDER — OXYTOCIN BOLUS FROM INFUSION
333.0000 mL | Freq: Once | INTRAVENOUS | Status: AC
  Administered 2020-12-04: 333 mL via INTRAVENOUS

## 2020-12-04 MED ORDER — BUTORPHANOL TARTRATE 1 MG/ML IJ SOLN: 1.0000 mg | INTRAMUSCULAR | Status: DC | PRN

## 2020-12-04 MED ORDER — LACTATED RINGERS IV SOLN
500.0000 mL | Freq: Once | INTRAVENOUS | Status: AC
  Administered 2020-12-04: 500 mL via INTRAVENOUS

## 2020-12-04 MED ORDER — TETANUS-DIPHTH-ACELL PERTUSSIS 5-2.5-18.5 LF-MCG/0.5 IM SUSY: 0.5000 mL | PREFILLED_SYRINGE | Freq: Once | INTRAMUSCULAR | Status: DC

## 2020-12-04 MED ORDER — LACTATED RINGERS IV BOLUS
1000.0000 mL | Freq: Once | INTRAVENOUS | Status: AC
  Administered 2020-12-04: 1000 mL via INTRAVENOUS

## 2020-12-04 MED ORDER — IBUPROFEN 600 MG PO TABS
600.0000 mg | ORAL_TABLET | Freq: Four times a day (QID) | ORAL | Status: DC
  Administered 2020-12-05 (×3): 600 mg via ORAL
  Filled 2020-12-04 (×3): qty 1

## 2020-12-04 MED ORDER — LIDOCAINE HCL (PF) 1 % IJ SOLN: 30.0000 mL | INTRAMUSCULAR | Status: DC | PRN

## 2020-12-04 MED ORDER — LIDOCAINE HCL (PF) 1 % IJ SOLN
INTRAMUSCULAR | Status: DC | PRN
  Administered 2020-12-04: 3 mL via SUBCUTANEOUS

## 2020-12-04 MED ORDER — LACTATED RINGERS IV SOLN: 500.0000 mL | INTRAVENOUS | Status: DC | PRN

## 2020-12-04 MED ORDER — ONDANSETRON HCL 4 MG/2ML IJ SOLN: 4.0000 mg | Freq: Four times a day (QID) | INTRAMUSCULAR | Status: DC | PRN

## 2020-12-04 MED ORDER — FENTANYL 2.5 MCG/ML W/ROPIVACAINE 0.15% IN NS 100 ML EPIDURAL (ARMC)
12.0000 mL/h | EPIDURAL | Status: DC
  Administered 2020-12-04: 12 mL/h via EPIDURAL

## 2020-12-04 MED ORDER — AMMONIA AROMATIC IN INHA
RESPIRATORY_TRACT | Status: AC
  Filled 2020-12-04: qty 10

## 2020-12-04 MED ORDER — DOCUSATE SODIUM 100 MG PO CAPS
100.0000 mg | ORAL_CAPSULE | Freq: Two times a day (BID) | ORAL | Status: DC
  Administered 2020-12-04 – 2020-12-05 (×2): 100 mg via ORAL
  Filled 2020-12-04 (×2): qty 1

## 2020-12-04 MED ORDER — OXYTOCIN-SODIUM CHLORIDE 30-0.9 UT/500ML-% IV SOLN
1.0000 m[IU]/min | INTRAVENOUS | Status: DC
  Administered 2020-12-04: 2 m[IU]/min via INTRAVENOUS

## 2020-12-04 MED ORDER — LIDOCAINE HCL (PF) 2 % IJ SOLN
INTRAMUSCULAR | Status: DC | PRN
  Administered 2020-12-04: 600 mg via EPIDURAL

## 2020-12-04 MED ORDER — MISOPROSTOL 25 MCG QUARTER TABLET
50.0000 ug | ORAL_TABLET | ORAL | Status: DC | PRN
  Filled 2020-12-04: qty 2

## 2020-12-04 MED ORDER — IBUPROFEN 600 MG PO TABS
600.0000 mg | ORAL_TABLET | Freq: Four times a day (QID) | ORAL | Status: DC
  Administered 2020-12-04: 600 mg via ORAL
  Filled 2020-12-04: qty 1

## 2020-12-04 MED ORDER — SOD CITRATE-CITRIC ACID 500-334 MG/5ML PO SOLN: 30.0000 mL | ORAL | Status: DC | PRN

## 2020-12-04 MED ORDER — OXYTOCIN-SODIUM CHLORIDE 30-0.9 UT/500ML-% IV SOLN: 1.0000 m[IU]/min | INTRAVENOUS | Status: DC

## 2020-12-04 MED ORDER — ACETAMINOPHEN 325 MG PO TABS
650.0000 mg | ORAL_TABLET | ORAL | Status: DC | PRN
  Filled 2020-12-04: qty 2

## 2020-12-04 MED ORDER — FENTANYL 2.5 MCG/ML W/ROPIVACAINE 0.15% IN NS 100 ML EPIDURAL (ARMC)
EPIDURAL | Status: AC
  Filled 2020-12-04: qty 100

## 2020-12-04 MED ORDER — PRENATAL MULTIVITAMIN CH
1.0000 | ORAL_TABLET | Freq: Every day | ORAL | Status: DC
  Administered 2020-12-05: 1 via ORAL
  Filled 2020-12-04: qty 1

## 2020-12-04 MED ORDER — MISOPROSTOL 200 MCG PO TABS
ORAL_TABLET | ORAL | Status: AC
  Filled 2020-12-04: qty 4

## 2020-12-04 MED ORDER — LACTATED RINGERS IV SOLN: INTRAVENOUS | Status: DC

## 2020-12-04 MED ORDER — DIPHENHYDRAMINE HCL 25 MG PO CAPS: 25.0000 mg | ORAL_CAPSULE | Freq: Four times a day (QID) | ORAL | Status: DC | PRN

## 2020-12-04 MED ORDER — TERBUTALINE SULFATE 1 MG/ML IJ SOLN: 0.2500 mg | Freq: Once | INTRAMUSCULAR | Status: DC | PRN

## 2020-12-04 MED ORDER — SIMETHICONE 80 MG PO CHEW: 80.0000 mg | CHEWABLE_TABLET | ORAL | Status: DC | PRN

## 2020-12-04 MED ORDER — OXYTOCIN-SODIUM CHLORIDE 30-0.9 UT/500ML-% IV SOLN: 2.5000 [IU]/h | INTRAVENOUS | Status: DC | PRN

## 2020-12-04 MED ORDER — LIDOCAINE HCL (PF) 1 % IJ SOLN
INTRAMUSCULAR | Status: AC
  Filled 2020-12-04: qty 30

## 2020-12-04 MED ORDER — SODIUM CHLORIDE 0.9 % IV SOLN
INTRAVENOUS | Status: DC | PRN
  Administered 2020-12-04: 7 mL via EPIDURAL

## 2020-12-04 NOTE — Anesthesia Procedure Notes (Signed)
Epidural Patient location during procedure: OB Start time: 12/04/2020 12:58 PM End time: 12/04/2020 1:20 PM  Staffing Anesthesiologist: Corinda Gubler, MD Performed: anesthesiologist   Preanesthetic Checklist Completed: patient identified, IV checked, site marked, risks and benefits discussed, surgical consent, monitors and equipment checked, pre-op evaluation and timeout performed  Epidural Patient position: sitting Prep: ChloraPrep Patient monitoring: heart rate, continuous pulse ox and blood pressure Approach: midline Location: L3-L4 Injection technique: LOR saline  Needle:  Needle type: Tuohy  Needle gauge: 17 G Needle length: 9 cm and 9 Needle insertion depth: 4.5 cm Catheter type: closed end flexible Catheter size: 19 Gauge Catheter at skin depth: 10 cm Test dose: negative and 1.5% lidocaine with Epi 1:200 K  Assessment Sensory level: T10 Events: blood not aspirated, injection not painful, no injection resistance, no paresthesia and negative IV test  Additional Notes 1st attempt Pt. Evaluated and documentation done after procedure finished. Patient identified. Risks/Benefits/Options discussed with patient including but not limited to bleeding, infection, nerve damage, paralysis, failed block, incomplete pain control, headache, blood pressure changes, nausea, vomiting, reactions to medication both or allergic, itching and postpartum back pain. Confirmed with bedside nurse the patient's most recent platelet count. Confirmed with patient that they are not currently taking any anticoagulation, have any bleeding history or any family history of bleeding disorders. Patient expressed understanding and wished to proceed. All questions were answered. Sterile technique was used throughout the entire procedure. Please see nursing notes for vital signs. Test dose was given through epidural catheter and negative prior to continuing to dose epidural or start infusion. Warning signs of high  block given to the patient including shortness of breath, tingling/numbness in hands, complete motor block, or any concerning symptoms with instructions to call for help. Patient was given instructions on fall risk and not to get out of bed. All questions and concerns addressed with instructions to call with any issues or inadequate analgesia.     Patient tolerated the insertion well without immediate complications.  Reason for block: procedure for painReason for block:procedure for pain

## 2020-12-04 NOTE — OB Triage Note (Signed)
Pt presents c/o ctx that  started around 4:30 this morning. She states are 2-8 mins apart. Pt denies bleeding or LOF, but reports a mucousy discharge. Pt reports positive fetal movement. VSS. Will continue to monitor.

## 2020-12-04 NOTE — Progress Notes (Signed)
Discussed powdered formula preparation and storage for home use. Educated about amount and baby led frequency of feedings for infant. Patient pumped 9ml of breast milk. RN discussed storage, how long breast milk lasts, and cleaning of pump parts with patient. Breast milk labels given. Patient to call RN with any more questions.

## 2020-12-04 NOTE — Progress Notes (Signed)
Breast pump provided to pt per pt request. Breast pump operation and how to clean inst given to pt. Verbalized understanding. No other needs or concerns at this time

## 2020-12-04 NOTE — Anesthesia Preprocedure Evaluation (Signed)
Anesthesia Evaluation  Patient identified by MRN, date of birth, ID band Patient awake    Reviewed: Allergy & Precautions, NPO status , Patient's Chart, lab work & pertinent test results  History of Anesthesia Complications Negative for: history of anesthetic complications  Airway Mallampati: II  TM Distance: >3 FB Neck ROM: Full    Dental no notable dental hx. (+) Teeth Intact   Pulmonary neg pulmonary ROS, neg sleep apnea, neg COPD, Patient abstained from smoking.Not current smoker,    Pulmonary exam normal breath sounds clear to auscultation       Cardiovascular Exercise Tolerance: Good METS(-) hypertension(-) CAD and (-) Past MI negative cardio ROS  (-) dysrhythmias  Rhythm:Regular Rate:Normal - Systolic murmurs    Neuro/Psych negative neurological ROS  negative psych ROS   GI/Hepatic neg GERD  ,(+)     (-) substance abuse  ,   Endo/Other  neg diabetes  Renal/GU negative Renal ROS     Musculoskeletal   Abdominal   Peds  Hematology  (+) anemia ,   Anesthesia Other Findings Past Medical History: No date: Anemia  Reproductive/Obstetrics (+) Pregnancy                             Anesthesia Physical Anesthesia Plan  ASA: 2  Anesthesia Plan: Epidural   Post-op Pain Management:    Induction:   PONV Risk Score and Plan: 2 and Treatment may vary due to age or medical condition and Ondansetron  Airway Management Planned: Natural Airway  Additional Equipment:   Intra-op Plan:   Post-operative Plan:   Informed Consent: I have reviewed the patients History and Physical, chart, labs and discussed the procedure including the risks, benefits and alternatives for the proposed anesthesia with the patient or authorized representative who has indicated his/her understanding and acceptance.       Plan Discussed with: Surgeon  Anesthesia Plan Comments: (Discussed R/B/A of  neuraxial anesthesia technique with patient: - rare risks of spinal/epidural hematoma, nerve damage, infection - Risk of PDPH - Risk of itching - Risk of nausea and vomiting - Risk of poor block necessitating replacement of epidural. Patient voiced understanding.)        Anesthesia Quick Evaluation

## 2020-12-04 NOTE — H&P (Signed)
History and Physical   HPI  [facility] Susan Hoover is a 20 y.o. G2P1001 at [redacted]w[redacted]d Estimated Date of Delivery: 11/28/20 who is being admitted for labor management.  C/o frequent regular contractions.   OB History  OB History  Gravida Para Term Preterm AB Living  2 1 1  0 0 1  SAB IAB Ectopic Multiple Live Births  0 0 0 0 1    # Outcome Date GA Lbr Len/2nd Weight Sex Delivery Anes PTL Lv  2 Current           1 Term 12/29/17 [redacted]w[redacted]d / 02:00 3730 g F Vag-Vacuum Local  LIV     Birth Comments: N/A     Name: [name], [name]     Apgar1: 8  Apgar5: 9    PROBLEM LIST  Pregnancy complications or risks: Patient Active Problem List   Diagnosis Date Noted   Post-dates pregnancy 12/04/2020   Irregular uterine contractions 12/02/2020   Insufficient prenatal care in third trimester 12/01/2020   Indication for care in labor or delivery 11/21/2020   Recurrent urinary tract infection affecting pregnancy in third trimester 10/07/2020   History of pyelonephritis during pregnancy 06/18/2020   Post-term pregnancy, 40-42 weeks of gestation 12/29/2017   Pyelonephritis affecting pregnancy in second trimester 08/09/2017   Abdominal pain 08/07/2017    Prenatal labs and studies: ABO, Rh: --/--/A POS (07/16 1116) Antibody: NEG (07/16 1116) Rubella: 2.85 (12/16 1111) RPR: Non Reactive (05/05 1109)  HBsAg: Negative (12/16 1111)  HIV: Non Reactive (12/16 1111)  05-01-1997-- (07/16 1129)   Past Medical History:  Diagnosis Date   Anemia      Past Surgical History:  Procedure Laterality Date   NO PAST SURGERIES       Medications    Current Discharge Medication List     CONTINUE these medications which have NOT CHANGED   Details  Prenatal Vit-Fe Fumarate-FA (MULTIVITAMIN-PRENATAL) 27-0.8 MG TABS tablet Take 1 tablet by mouth daily at 12 noon. Qty: 90 tablet, Refills: 3    ondansetron (ZOFRAN) 4 MG tablet Take 1 tablet (4 mg total) by mouth every 8 (eight)  hours as needed for nausea or vomiting. Qty: 30 tablet, Refills: 1         Allergies  Patient has no allergy information on record.  Review of Systems  Pertinent items noted in HPI and remainder of comprehensive ROS otherwise negative.  Physical Exam  BP 117/63   Pulse 73   Temp 98.1 F (36.7 C) (Oral)   Resp 18   Ht 5\' 3"  (1.6 m)   Wt 81.2 kg   LMP 02/15/2020 (Exact Date)   SpO2 99%   BMI 31.71 kg/m   Lungs:  CTA B Cardio: RRR without M/R/G Abd: Soft, gravid, NT Presentation: cephalic EXT: No C/C/ 1+ Edema DTRs: 2+ B CERVIX: Dilation: 8.5 Effacement (%): 100 Cervical Position: Posterior Station: 0 Presentation: Vertex Exam by:: 02/17/2020 RN  See Prenatal records for more detailed PE.     FHR:  Variability: Good {> 6 bpm)  Toco: Uterine Contractions: Q2-4 min  Test Results  Results for orders placed or performed during the hospital encounter of 12/04/20 (from the past 24 hour(s))  Type and screen     Status: None   Collection Time: 12/04/20 11:16 AM  Result Value Ref Range   ABO/RH(D) A POS    Antibody Screen NEG    Sample Expiration      12/07/2020,2359 Performed at [facility]  Lab, [address] [place], [place] [address]   Resp Panel by RT-PCR (Flu A&B, Covid) Nasopharyngeal Swab     Status: None   Collection Time: 12/04/20 11:17 AM   Specimen: Nasopharyngeal Swab; Nasopharyngeal(NP) swabs in vial transport medium  Result Value Ref Range   SARS Coronavirus 2 by RT PCR NEGATIVE NEGATIVE   Influenza A by PCR NEGATIVE NEGATIVE   Influenza B by PCR NEGATIVE NEGATIVE  Group B strep by PCR     Status: None   Collection Time: 12/04/20 11:29 AM   Specimen: Vaginal/Rectal; Genital  Result Value Ref Range   Group B strep by PCR NEGATIVE NEGATIVE  CBC     Status: Abnormal   Collection Time: 12/04/20 12:00 PM  Result Value Ref Range   WBC 12.3 (H) 4.0 - 10.5 K/uL   RBC 4.05 3.87 - 5.11 MIL/uL   Hemoglobin 9.3 (L) 12.0 - 15.0 g/dL    HCT 62.2 (L) 29.7 - 46.0 %   MCV 75.6 (L) 80.0 - 100.0 fL   MCH 23.0 (L) 26.0 - 34.0 pg   MCHC 30.4 30.0 - 36.0 g/dL   RDW 98.9 (H) 21.1 - 94.1 %   Platelets 179 150 - 400 K/uL   nRBC 0.2 0.0 - 0.2 %   Group B Strep negative  Assessment   G2P1001 at [redacted]w[redacted]d Estimated Date of Delivery: 11/28/20  The fetus is reassuring.   Patient Active Problem List   Diagnosis Date Noted   Post-dates pregnancy 12/04/2020   Irregular uterine contractions 12/02/2020   Insufficient prenatal care in third trimester 12/01/2020   Indication for care in labor or delivery 11/21/2020   Recurrent urinary tract infection affecting pregnancy in third trimester 10/07/2020   History of pyelonephritis during pregnancy 06/18/2020   Post-term pregnancy, 40-42 weeks of gestation 12/29/2017   Pyelonephritis affecting pregnancy in second trimester 08/09/2017   Abdominal pain 08/07/2017    Plan  1. Admit to L&D :   2. EFM: -- Category 1 3. Stadol or Epidural if desired.   4. Admission labs  5. Expect vaginal delivery  Susan Hoover, M.D. 12/04/2020 4:09 PM

## 2020-12-05 LAB — RPR: RPR Ser Ql: NONREACTIVE

## 2020-12-05 MED ORDER — IBUPROFEN 600 MG PO TABS: 600.0000 mg | ORAL_TABLET | Freq: Four times a day (QID) | ORAL | 0 refills | Status: AC

## 2020-12-05 NOTE — Progress Notes (Signed)
Encouraged patient to get out of bed and ambulate to the bathroom. Patient refuses at the current time and wants to get some rest. RN explained importance of emptying bladder. Patient agrees to ambulate and attempt to void during next RN round. RN to get patient up by 0400.

## 2020-12-05 NOTE — Progress Notes (Signed)
Patient discharged with infant. Discharge instructions, prescriptions, and follow up appointments given to and reviewed with patient. Patient verbalized understanding. Will be escorted out by axillary.  °

## 2020-12-05 NOTE — Discharge Summary (Signed)
Patient Name: Susan Hoover DOB: 06-Oct-2000 MRN: [id number]                            Discharge Summary  Date of Admission: 12/04/2020 Date of Discharge: 12/05/2020 Delivering Provider: Linzie Collin   Admitting Diagnosis: Post-dates pregnancy [O48.0] at [redacted]w[redacted]d Secondary diagnosis:  Active Problems:   Post-dates pregnancy Active labor Mode of Delivery: normal spontaneous vaginal delivery              Discharge diagnosis: Term Pregnancy Delivered    Viable female infant Intrapartum Procedures: epidural and pitocin augmentation   Post partum procedures:   Complications: none                     Discharge Day SOAP Note:  Progress Note - Vaginal Delivery  [facility] Susan Hoover is a 20 y.o. [phone] now PP day 1 s/p Vaginal, Spontaneous . Delivery was uncomplicated  Subjective  The patient has the following complaints: has no unusual complaints  Pain is controlled with current medications.   Patient is urinating without difficulty.  She is ambulating well.   Desires discharge today.  Objective  Vital signs: BP 110/64 (BP Location: Right Arm)   Pulse 75   Temp 98.4 F (36.9 C) (Oral)   Resp 18   Ht 5\' 3"  (1.6 m)   Wt 81.2 kg   LMP 02/15/2020 (Exact Date)   SpO2 100%   Breastfeeding Unknown   BMI 31.71 kg/m   Physical Exam: Gen: NAD Fundus Fundal Tone: Firm  Lochia Amount: Scant        Data Review Labs: Lab Results  Component Value Date   WBC 12.3 (H) 12/04/2020   HGB 9.3 (L) 12/04/2020   HCT 30.6 (L) 12/04/2020   MCV 75.6 (L) 12/04/2020   PLT 179 12/04/2020   CBC Latest Ref Rng & Units 12/04/2020 09/23/2020 07/27/2020  WBC 4.0 - 10.5 K/uL 12.3(H) 10.2 9.7  Hemoglobin 12.0 - 15.0 g/dL 09/26/2020) 3.6(R) 4.4(R)  Hematocrit 36.0 - 46.0 % 30.6(L) 28.7(L) 30.8(L)  Platelets 150 - 400 K/uL 179 175 184   A POS  Edinburgh Score: Edinburgh Postnatal Depression Scale Screening Tool 12/05/2020  I have been able to laugh and see the  funny side of things. 0  I have looked forward with enjoyment to things. 0  I have blamed myself unnecessarily when things went wrong. 0  I have been anxious or worried for no good reason. 0  I have felt scared or panicky for no good reason. 0  Things have been getting on top of me. 1  I have been so unhappy that I have had difficulty sleeping. 0  I have felt sad or miserable. 0  I have been so unhappy that I have been crying. 0  The thought of harming myself has occurred to me. 0  Edinburgh Postnatal Depression Scale Total 1    Assessment/Plan  Active Problems:   Post-dates pregnancy    Plan for discharge today.  Discharge Instructions: Per After Visit Summary. Activity: Advance as tolerated. Pelvic rest for 6 weeks.  Also refer to After Visit Summary Diet: Regular Medications: Allergies as of 12/05/2020   Not on File      Medication List     STOP taking these medications    ondansetron 4 MG tablet Commonly known as: Zofran       TAKE these medications  ibuprofen 600 MG tablet Commonly known as: ADVIL Take 1 tablet (600 mg total) by mouth every 6 (six) hours.   multivitamin-prenatal 27-0.8 MG Tabs tablet Take 1 tablet by mouth daily at 12 noon.       Outpatient follow up:   Follow-up Information     Linzie Collin, MD Follow up in 4 week(s).   Specialties: Obstetrics and Gynecology, Radiology Contact information: [address] [place] [address] [phone]                Postpartum contraception: Will discuss at first office visit post-partum  Discharged Condition: good  Discharged to: home  Newborn Data: Disposition:home with mother  Apgars: APGAR (1 MIN): 8   APGAR (5 MINS): 9   APGAR (10 MINS):    Baby Feeding: Bottle and Breast    [name], M.D. 12/05/2020 9:29 AM

## 2020-12-07 NOTE — Anesthesia Postprocedure Evaluation (Signed)
Anesthesia Post Note  Patient: Susan Hoover  Procedure(s) Performed: AN AD HOC LABOR EPIDURAL  Patient location during evaluation: Mother Baby Anesthesia Type: Epidural Level of consciousness: awake and alert Pain management: pain level controlled Vital Signs Assessment: post-procedure vital signs reviewed and stable Respiratory status: spontaneous breathing, nonlabored ventilation and respiratory function stable Cardiovascular status: stable Postop Assessment: no headache, no backache and epidural receding Anesthetic complications: no   No notable events documented.   Last Vitals: There were no vitals filed for this visit.  Last Pain: There were no vitals filed for this visit.               Corinda Gubler

## 2020-12-24 DIAGNOSIS — F4320 Adjustment disorder, unspecified: Secondary | ICD-10-CM | POA: Diagnosis not present

## 2020-12-31 DIAGNOSIS — F4320 Adjustment disorder, unspecified: Secondary | ICD-10-CM | POA: Diagnosis not present

## 2021-01-14 DIAGNOSIS — F4320 Adjustment disorder, unspecified: Secondary | ICD-10-CM | POA: Diagnosis not present

## 2021-01-21 DIAGNOSIS — F4320 Adjustment disorder, unspecified: Secondary | ICD-10-CM | POA: Diagnosis not present

## 2021-01-28 DIAGNOSIS — F4320 Adjustment disorder, unspecified: Secondary | ICD-10-CM | POA: Diagnosis not present

## 2021-02-04 DIAGNOSIS — F4320 Adjustment disorder, unspecified: Secondary | ICD-10-CM | POA: Diagnosis not present

## 2021-02-07 ENCOUNTER — Encounter: Payer: Medicaid Other | Admitting: Obstetrics and Gynecology

## 2021-02-18 DIAGNOSIS — F4320 Adjustment disorder, unspecified: Secondary | ICD-10-CM | POA: Diagnosis not present

## 2021-02-24 ENCOUNTER — Encounter: Payer: Medicaid Other | Admitting: Obstetrics and Gynecology

## 2021-02-25 DIAGNOSIS — F4320 Adjustment disorder, unspecified: Secondary | ICD-10-CM | POA: Diagnosis not present

## 2021-03-04 ENCOUNTER — Encounter: Payer: Self-pay | Admitting: Obstetrics and Gynecology

## 2021-03-04 DIAGNOSIS — F4320 Adjustment disorder, unspecified: Secondary | ICD-10-CM | POA: Diagnosis not present

## 2021-03-11 DIAGNOSIS — F4320 Adjustment disorder, unspecified: Secondary | ICD-10-CM | POA: Diagnosis not present

## 2021-03-18 DIAGNOSIS — F4320 Adjustment disorder, unspecified: Secondary | ICD-10-CM | POA: Diagnosis not present

## 2021-04-29 DIAGNOSIS — F4320 Adjustment disorder, unspecified: Secondary | ICD-10-CM | POA: Diagnosis not present

## 2021-07-18 DIAGNOSIS — Z3202 Encounter for pregnancy test, result negative: Secondary | ICD-10-CM | POA: Diagnosis not present

## 2021-07-18 DIAGNOSIS — Z30430 Encounter for insertion of intrauterine contraceptive device: Secondary | ICD-10-CM | POA: Diagnosis not present

## 2021-08-05 LAB — FETAL NONSTRESS TEST

## 2021-09-29 DIAGNOSIS — Z6824 Body mass index (BMI) 24.0-24.9, adult: Secondary | ICD-10-CM | POA: Diagnosis not present

## 2021-09-29 DIAGNOSIS — R635 Abnormal weight gain: Secondary | ICD-10-CM | POA: Diagnosis not present

## 2021-09-29 DIAGNOSIS — E559 Vitamin D deficiency, unspecified: Secondary | ICD-10-CM | POA: Diagnosis not present

## 2021-09-29 DIAGNOSIS — D509 Iron deficiency anemia, unspecified: Secondary | ICD-10-CM | POA: Diagnosis not present

## 2021-10-06 DIAGNOSIS — Z1339 Encounter for screening examination for other mental health and behavioral disorders: Secondary | ICD-10-CM | POA: Diagnosis not present

## 2021-10-06 DIAGNOSIS — Z6824 Body mass index (BMI) 24.0-24.9, adult: Secondary | ICD-10-CM | POA: Diagnosis not present

## 2021-10-06 DIAGNOSIS — D509 Iron deficiency anemia, unspecified: Secondary | ICD-10-CM | POA: Diagnosis not present

## 2021-10-06 DIAGNOSIS — E860 Dehydration: Secondary | ICD-10-CM | POA: Diagnosis not present

## 2021-10-06 DIAGNOSIS — K760 Fatty (change of) liver, not elsewhere classified: Secondary | ICD-10-CM | POA: Diagnosis not present

## 2021-10-06 DIAGNOSIS — R635 Abnormal weight gain: Secondary | ICD-10-CM | POA: Diagnosis not present

## 2021-10-06 DIAGNOSIS — Z1331 Encounter for screening for depression: Secondary | ICD-10-CM | POA: Diagnosis not present

## 2021-10-06 DIAGNOSIS — R748 Abnormal levels of other serum enzymes: Secondary | ICD-10-CM | POA: Diagnosis not present

## 2021-10-06 DIAGNOSIS — R6882 Decreased libido: Secondary | ICD-10-CM | POA: Diagnosis not present

## 2021-10-06 DIAGNOSIS — N898 Other specified noninflammatory disorders of vagina: Secondary | ICD-10-CM | POA: Diagnosis not present

## 2021-10-06 DIAGNOSIS — E559 Vitamin D deficiency, unspecified: Secondary | ICD-10-CM | POA: Diagnosis not present

## 2021-10-19 DIAGNOSIS — R0602 Shortness of breath: Secondary | ICD-10-CM | POA: Diagnosis not present

## 2021-12-02 DIAGNOSIS — H5213 Myopia, bilateral: Secondary | ICD-10-CM | POA: Diagnosis not present

## 2022-01-27 IMAGING — US US OB < 14 WEEKS - US OB TV
1 series · 15 of 28 positions shown · non-contrast
Comparison: Ultrasound 05/27/2019

CLINICAL DATA: Cramping

EXAM:
OBSTETRIC <14 WK US AND TRANSVAGINAL OB US
TECHNIQUE: Both transabdominal and transvaginal ultrasound examinations were
performed for complete evaluation of the gestation as well as the
maternal uterus, adnexal regions, and pelvic cul-de-sac.
Transvaginal technique was performed to assess early pregnancy.

[Series 1: us ob < 14 weeks - us ob tv · 15 of 70 slices shown]
[im 1/70]
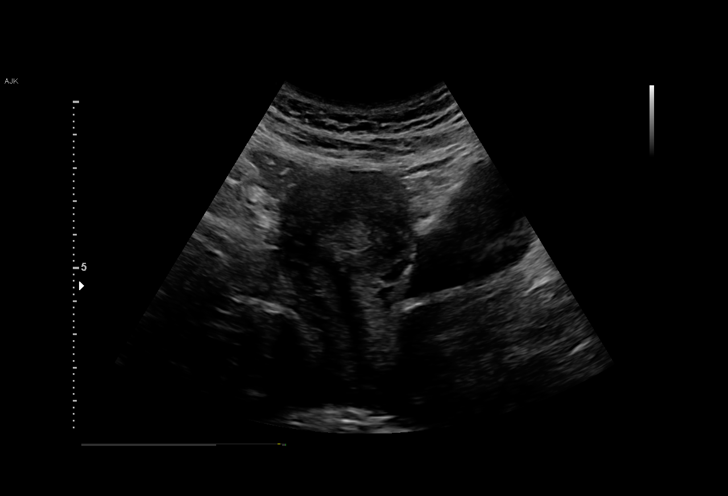
[im 6/70]
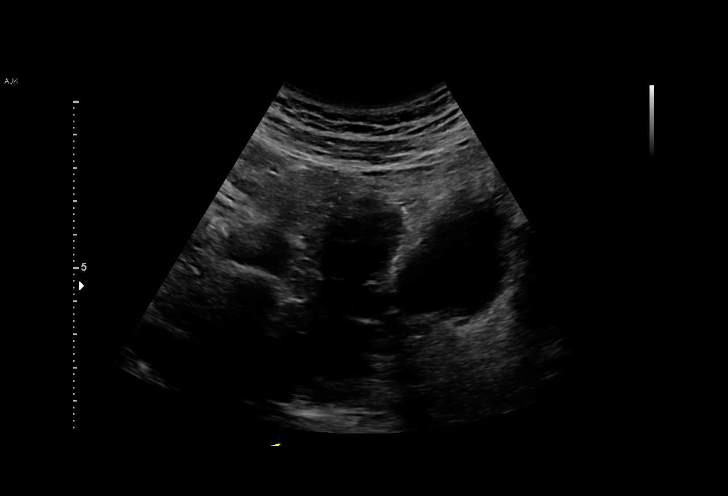
[im 11/70]
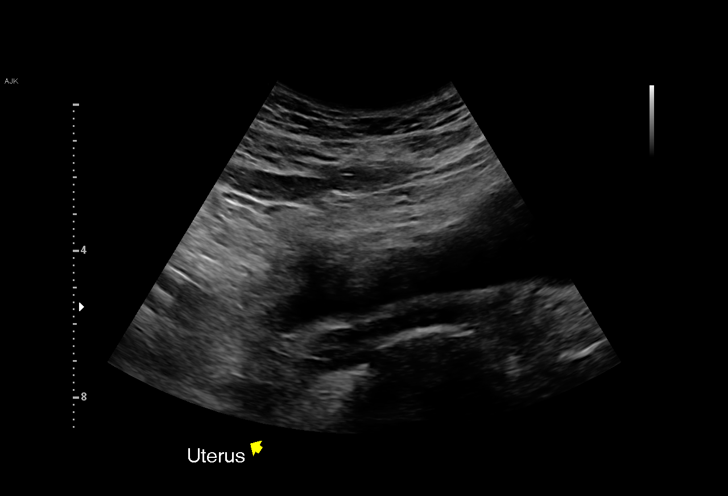
[im 16/70]
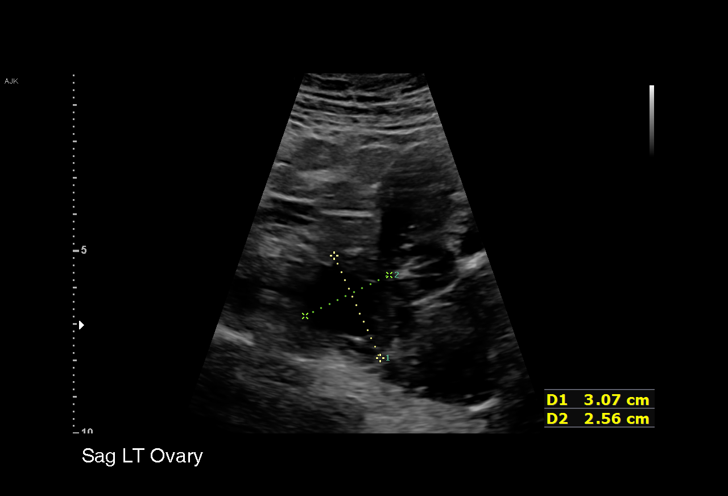
[im 21/70]
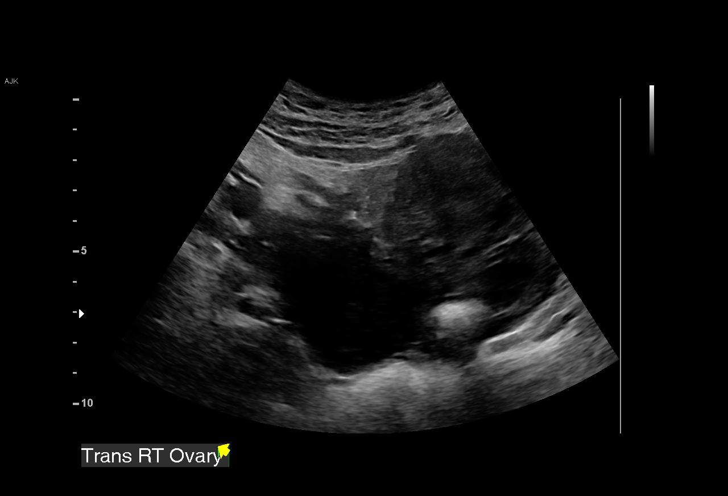
[im 26/70]
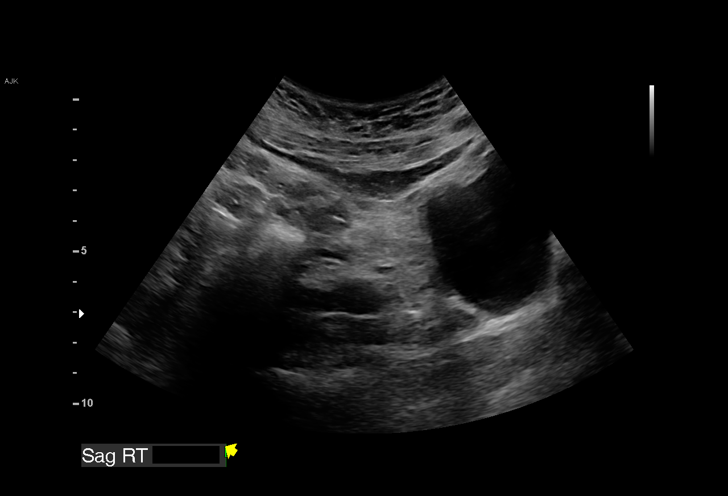
[im 31/70]
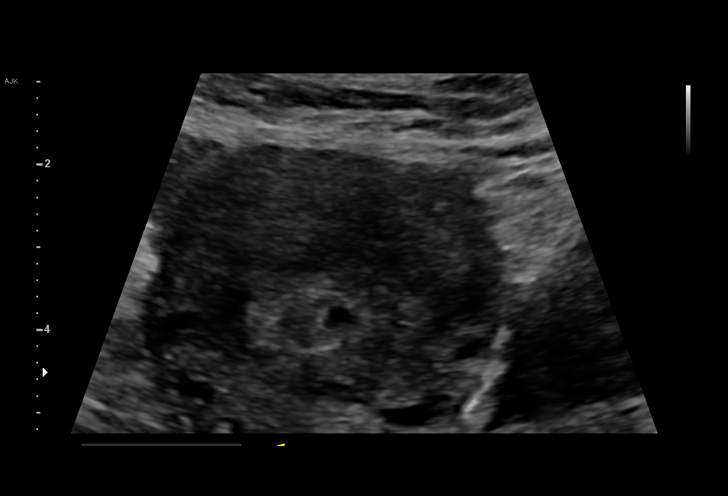
[im 36/70]
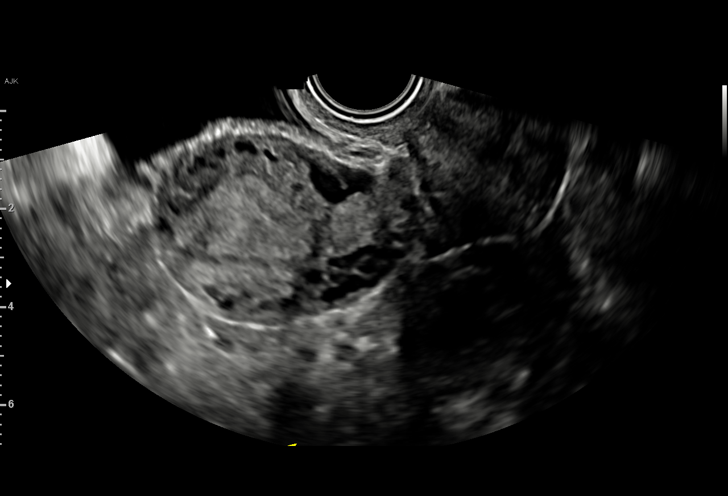
[im 39/70]
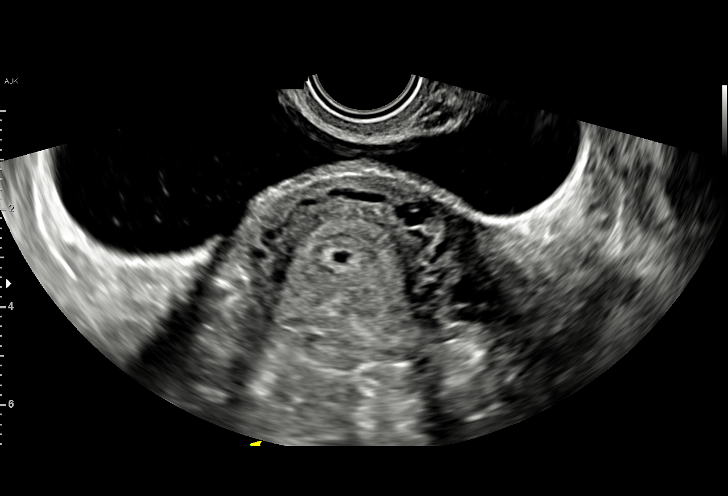
[im 44/70]
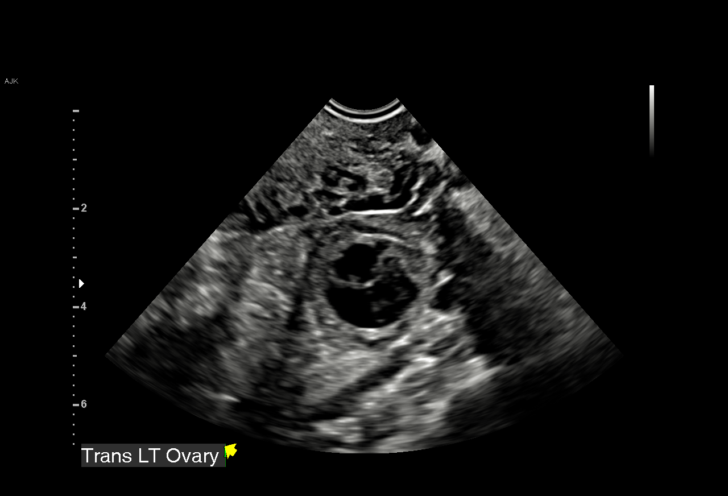
[im 49/70]
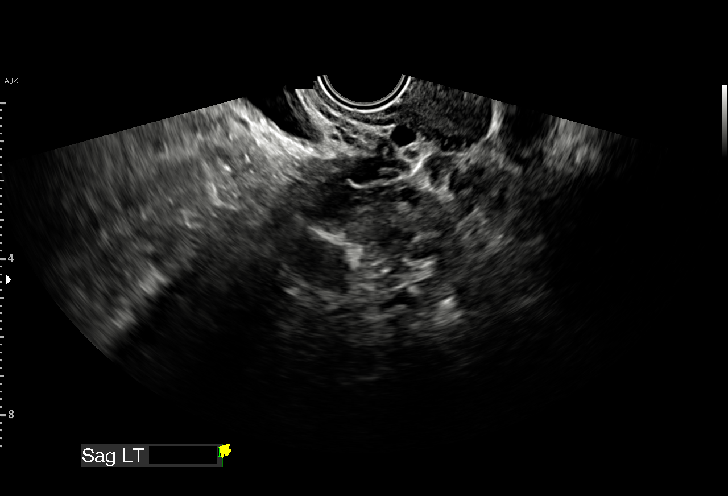
[im 54/70]
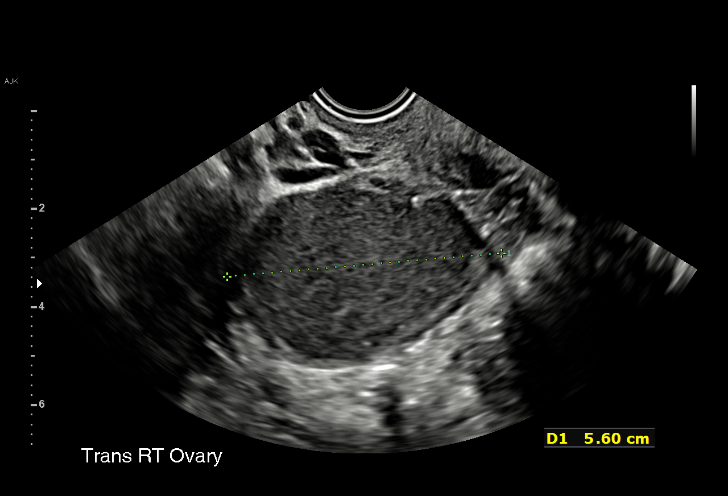
[im 59/70]
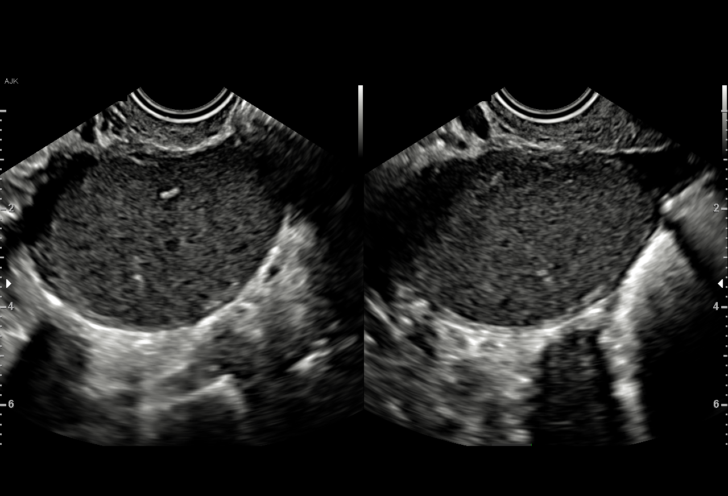
[im 64/70]
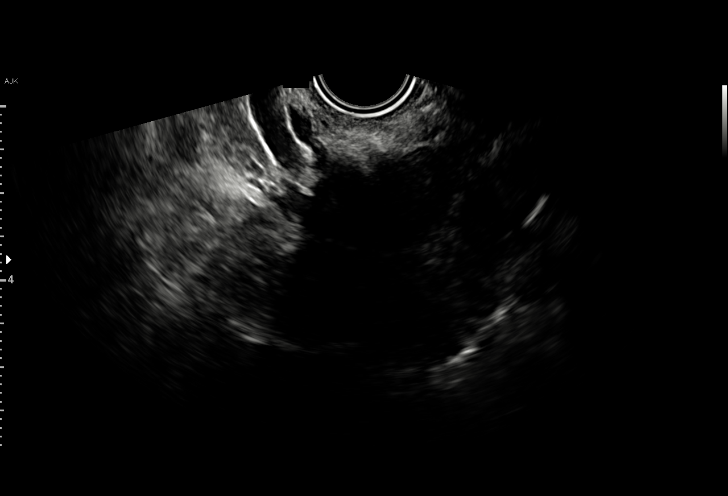
[im 70/70]
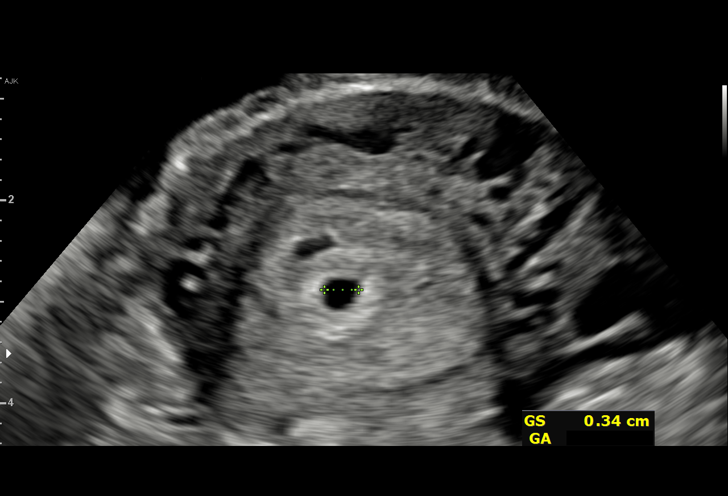

[15 of 28 positions shown; findings below may reference images not displayed]

FINDINGS: Intrauterine gestational sac: Single

Yolk sac:  Not Visualized.

Embryo:  Not Visualized.

Cardiac Activity: Not Visualized.

Heart Rate:   bpm

MSD: 3.4 mm   5 w   0 d

CRL:    mm    w    d                  US EDC:

Subchorionic hemorrhage:  None visualized.

Maternal uterus/adnexae: No free fluid. 4.9 x 4.8 x 3.7 cm
homogeneous right ovarian mass again noted most compatible with
endometrioma. This previously measured up to 5.6 cm.
IMPRESSION: Early intrauterine gestation, 5 weeks by mean sac diameter. No yolk
sac or fetal pole currently. This could be followed with repeat
ultrasound in 14 days to ensure expected progression.

Stable homogeneous right ovarian mass, favor endometrioma.

## 2022-02-28 ENCOUNTER — Encounter: Payer: Self-pay | Admitting: Obstetrics and Gynecology

## 2022-03-31 DIAGNOSIS — J069 Acute upper respiratory infection, unspecified: Secondary | ICD-10-CM | POA: Diagnosis not present

## 2022-03-31 DIAGNOSIS — Z20822 Contact with and (suspected) exposure to covid-19: Secondary | ICD-10-CM | POA: Diagnosis not present

## 2022-05-08 DIAGNOSIS — R748 Abnormal levels of other serum enzymes: Secondary | ICD-10-CM | POA: Diagnosis not present

## 2022-05-08 DIAGNOSIS — E663 Overweight: Secondary | ICD-10-CM | POA: Diagnosis not present

## 2022-05-08 DIAGNOSIS — Z6825 Body mass index (BMI) 25.0-25.9, adult: Secondary | ICD-10-CM | POA: Diagnosis not present

## 2022-05-29 DIAGNOSIS — Z6825 Body mass index (BMI) 25.0-25.9, adult: Secondary | ICD-10-CM | POA: Diagnosis not present

## 2022-05-29 DIAGNOSIS — K760 Fatty (change of) liver, not elsewhere classified: Secondary | ICD-10-CM | POA: Diagnosis not present

## 2022-06-08 DIAGNOSIS — Z6825 Body mass index (BMI) 25.0-25.9, adult: Secondary | ICD-10-CM | POA: Diagnosis not present

## 2022-06-08 DIAGNOSIS — D509 Iron deficiency anemia, unspecified: Secondary | ICD-10-CM | POA: Diagnosis not present

## 2022-07-06 DIAGNOSIS — Z6825 Body mass index (BMI) 25.0-25.9, adult: Secondary | ICD-10-CM | POA: Diagnosis not present

## 2022-07-06 DIAGNOSIS — K760 Fatty (change of) liver, not elsewhere classified: Secondary | ICD-10-CM | POA: Diagnosis not present

## 2022-07-06 DIAGNOSIS — E559 Vitamin D deficiency, unspecified: Secondary | ICD-10-CM | POA: Diagnosis not present

## 2022-07-13 DIAGNOSIS — R748 Abnormal levels of other serum enzymes: Secondary | ICD-10-CM | POA: Diagnosis not present

## 2022-07-13 DIAGNOSIS — Z6825 Body mass index (BMI) 25.0-25.9, adult: Secondary | ICD-10-CM | POA: Diagnosis not present

## 2022-07-21 DIAGNOSIS — Z419 Encounter for procedure for purposes other than remedying health state, unspecified: Secondary | ICD-10-CM | POA: Diagnosis not present

## 2022-08-11 IMAGING — US US OB FOLLOW-UP
1 series · 13 of 28 positions shown · non-contrast
Comparison: none

CLINICAL DATA: Follow-up incomplete fetal anatomic survey and fetal
growth.

EXAM:
OBSTETRIC 14+ WK ULTRASOUND FOLLOW-UP

[Series 1: us ob follow up · 13 of 61 slices shown]
[im 3/61]
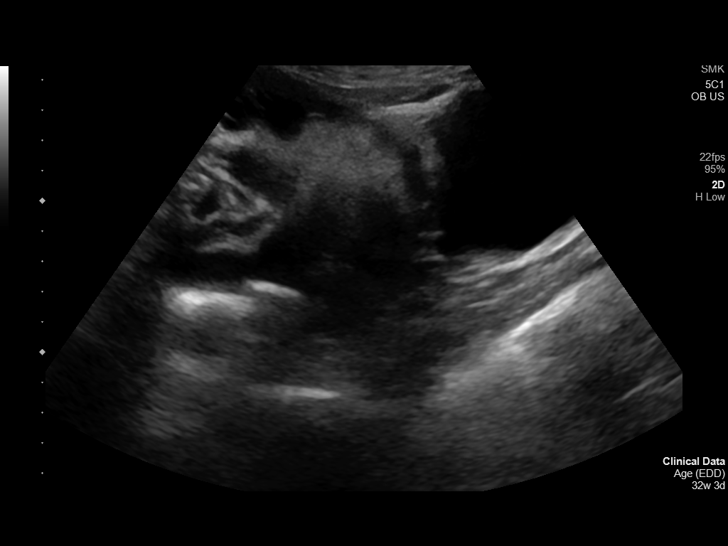
[im 7/61]
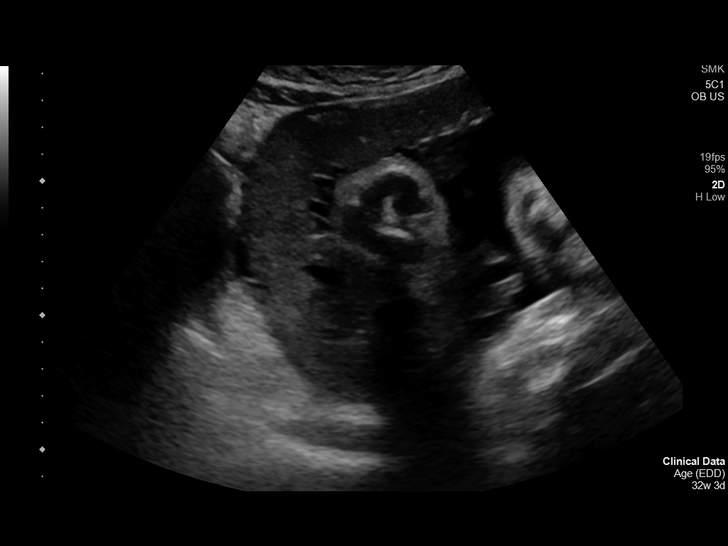
[im 12/61]
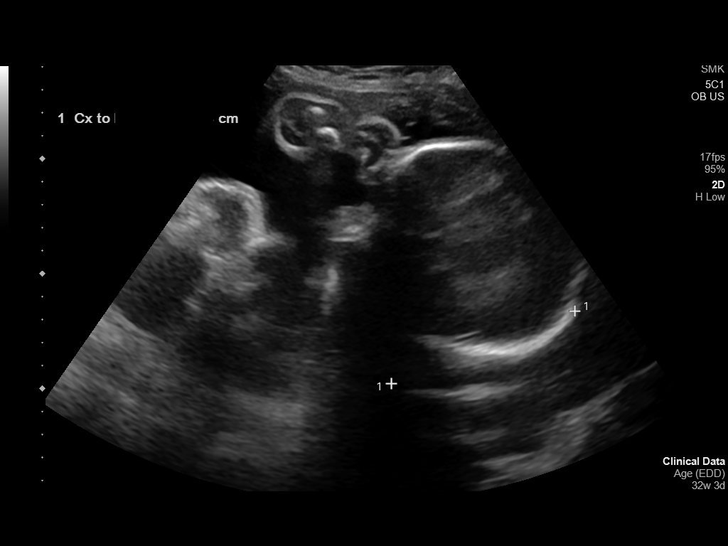
[im 16/61]
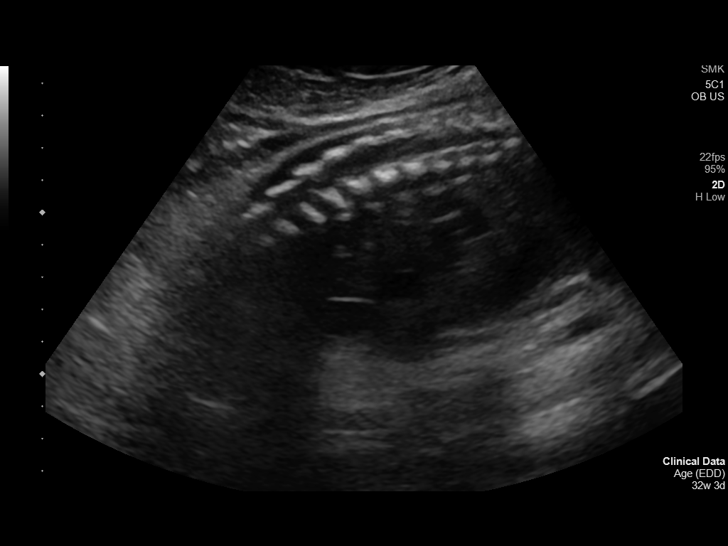
[im 21/61]
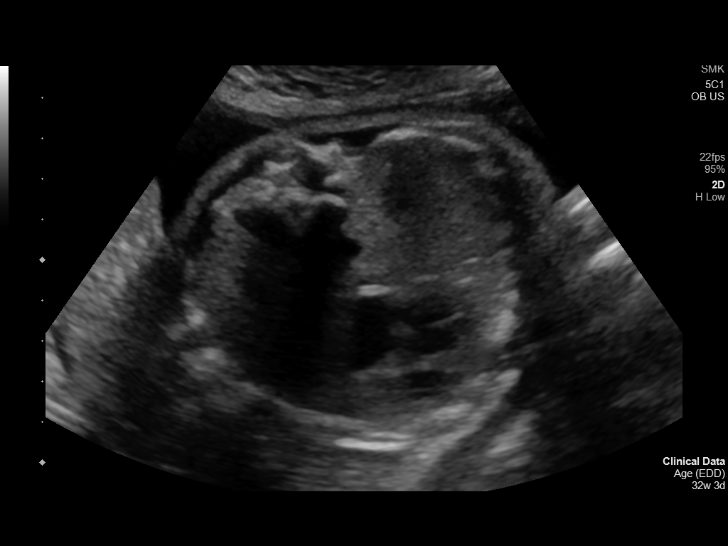
[im 25/61]
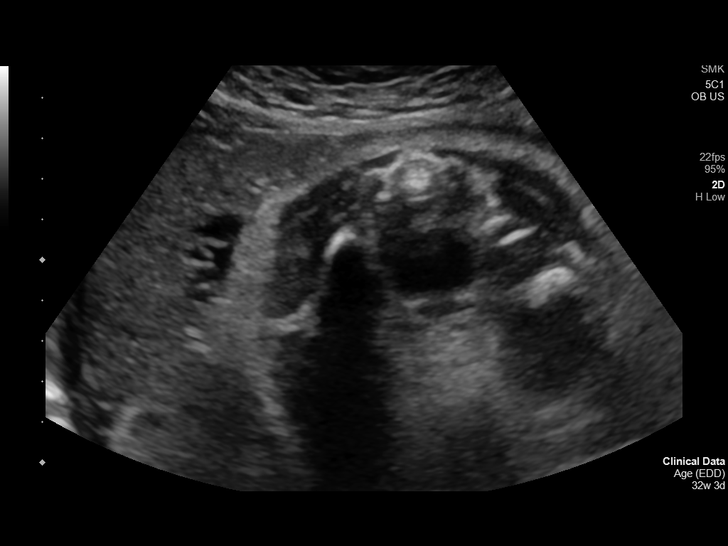
[im 32/61]
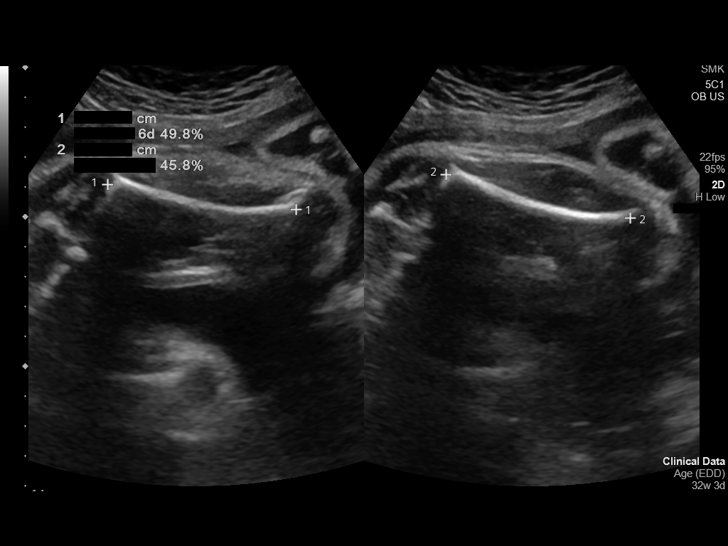
[im 36/61]
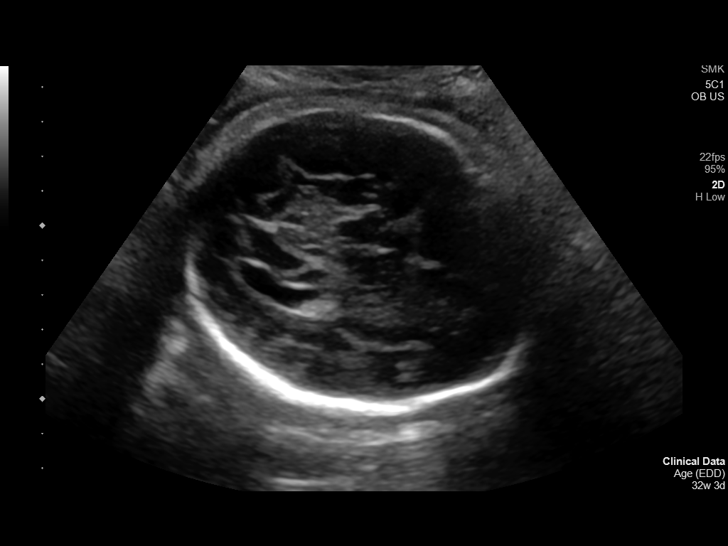
[im 41/61]
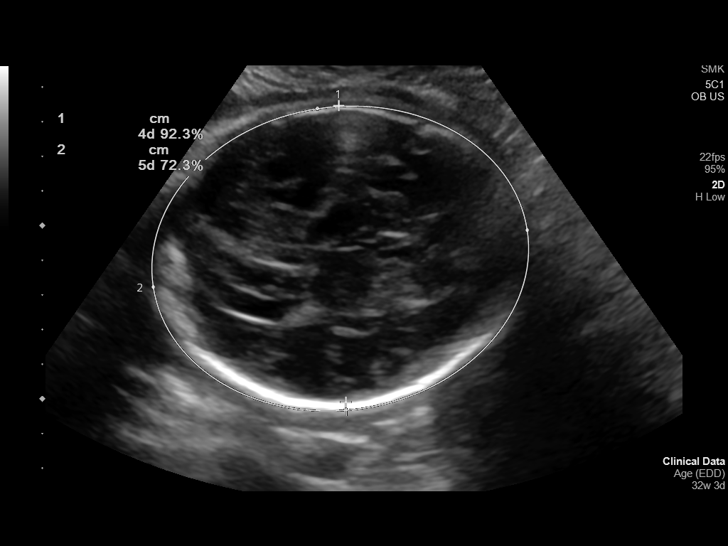
[im 45/61]
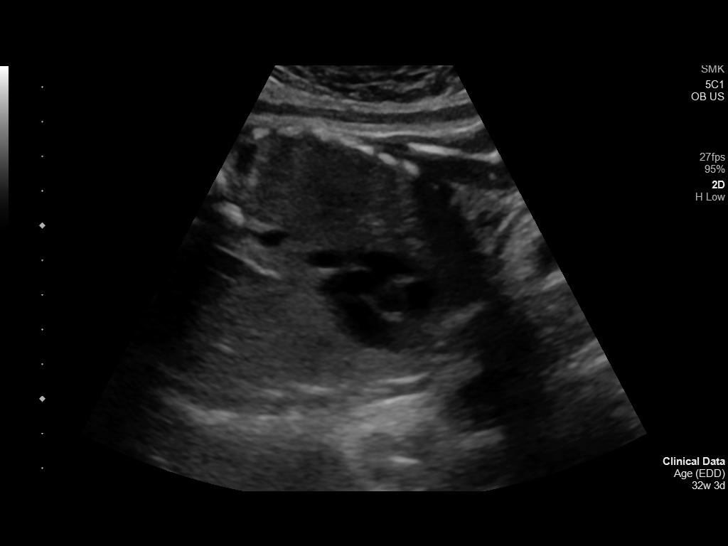
[im 49/61]
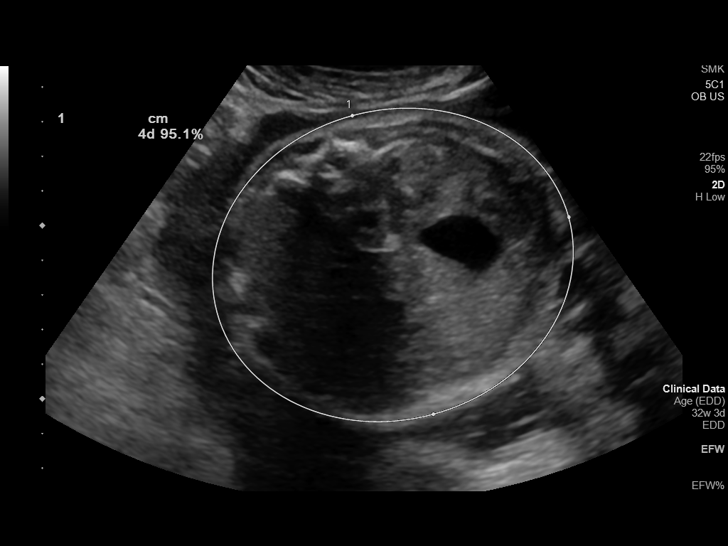
[im 54/61]
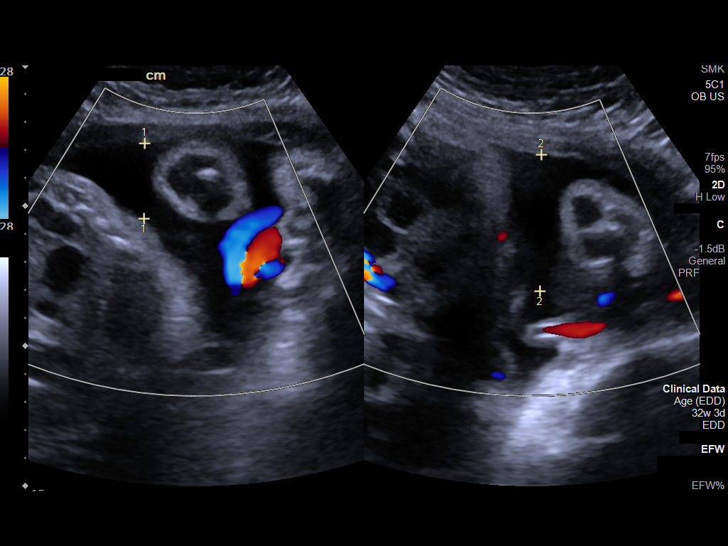
[im 58/61]
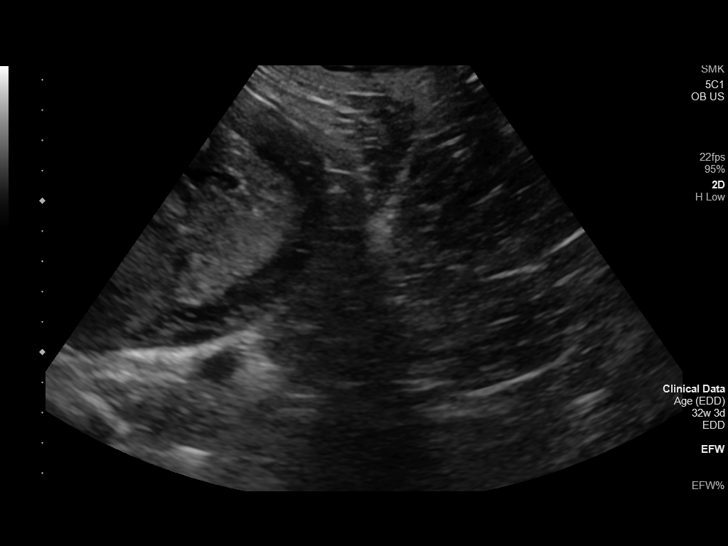

[13 of 28 positions shown; findings below may reference images not displayed]

FINDINGS: Number of Fetuses: 1

Heart Rate:  137 bpm

Movement: Yes

Presentation: Cephalic

Previa: No

Placental Location: Posterior

Amniotic Fluid (Subjective): Within normal limits

Amniotic Fluid (Objective):

Vertical pocket 4.9cm

AFI 12.4 cm (5%ile= 8.6 cm, 95%= 24.2 cm for 32 wks)

FETAL BIOMETRY

BPD:  8.4cm 34w 0d

HC:    31.1cm 34w 5d

AC:    30.4cm 34w 3d

FL:    6.4cm 32w 6d

Current Mean GA: 34w 2d US EDC: 11/19/2020

Assigned GA: 32w 3d Assigned EDC: 11/28/2020

Estimated Fetal Weight:  2,312g 85%ile

FETAL ANATOMY

Lateral Ventricles: Appears normal

Thalami/CSP: Appears normal

Posterior Fossa: Appears normal

Nuchal Region: Appears normal

Upper Lip: Previously seen

Spine: Appears normal

4 Chamber Heart on Left: Appears normal

LVOT: Previously seen

RVOT: Appears normal

Stomach on Left: Appears normal

3 Vessel Cord: Previously seen

Cord Insertion site: Previously seen

Kidneys: Appears normal

Bladder: Appears normal

Extremities: Previously seen

Sex: Previously Seen

Technical Limitations: Advanced gestational age and fetal position

Maternal Findings:

Cervix:  3.6 cm TA
IMPRESSION: Assigned GA currently 32 weeks 3 days. EFW currently at 85 %ile
concerning for developing large for gestational age fetus. Recommend
continued ultrasound follow-up of fetal growth in 3-4 weeks.

Amniotic fluid volume within normal limits, with AFI of 12.4 cm.

Fetal spine, nuchal region, and RVOT were visualized on today's
study. No fetal anatomic abnormality identified.

## 2022-08-21 DIAGNOSIS — Z419 Encounter for procedure for purposes other than remedying health state, unspecified: Secondary | ICD-10-CM | POA: Diagnosis not present

## 2022-09-20 DIAGNOSIS — Z419 Encounter for procedure for purposes other than remedying health state, unspecified: Secondary | ICD-10-CM | POA: Diagnosis not present

## 2022-10-21 DIAGNOSIS — Z419 Encounter for procedure for purposes other than remedying health state, unspecified: Secondary | ICD-10-CM | POA: Diagnosis not present

## 2022-11-20 DIAGNOSIS — Z419 Encounter for procedure for purposes other than remedying health state, unspecified: Secondary | ICD-10-CM | POA: Diagnosis not present

## 2022-12-21 DIAGNOSIS — Z419 Encounter for procedure for purposes other than remedying health state, unspecified: Secondary | ICD-10-CM | POA: Diagnosis not present

## 2023-01-21 DIAGNOSIS — Z419 Encounter for procedure for purposes other than remedying health state, unspecified: Secondary | ICD-10-CM | POA: Diagnosis not present

## 2023-02-20 DIAGNOSIS — Z419 Encounter for procedure for purposes other than remedying health state, unspecified: Secondary | ICD-10-CM | POA: Diagnosis not present

## 2023-03-23 DIAGNOSIS — Z419 Encounter for procedure for purposes other than remedying health state, unspecified: Secondary | ICD-10-CM | POA: Diagnosis not present

## 2023-04-03 DIAGNOSIS — N912 Amenorrhea, unspecified: Secondary | ICD-10-CM | POA: Insufficient documentation

## 2023-04-04 ENCOUNTER — Ambulatory Visit: Payer: Medicaid Other | Admitting: Urgent Care

## 2023-04-22 DIAGNOSIS — Z419 Encounter for procedure for purposes other than remedying health state, unspecified: Secondary | ICD-10-CM | POA: Diagnosis not present

## 2023-05-23 DIAGNOSIS — Z419 Encounter for procedure for purposes other than remedying health state, unspecified: Secondary | ICD-10-CM | POA: Diagnosis not present

## 2023-06-06 DIAGNOSIS — J102 Influenza due to other identified influenza virus with gastrointestinal manifestations: Secondary | ICD-10-CM | POA: Diagnosis not present

## 2023-06-06 DIAGNOSIS — Z03818 Encounter for observation for suspected exposure to other biological agents ruled out: Secondary | ICD-10-CM | POA: Diagnosis not present

## 2023-06-06 DIAGNOSIS — R059 Cough, unspecified: Secondary | ICD-10-CM | POA: Diagnosis not present

## 2023-06-06 DIAGNOSIS — R110 Nausea: Secondary | ICD-10-CM | POA: Diagnosis not present

## 2023-06-23 DIAGNOSIS — Z419 Encounter for procedure for purposes other than remedying health state, unspecified: Secondary | ICD-10-CM | POA: Diagnosis not present

## 2023-07-21 DIAGNOSIS — Z419 Encounter for procedure for purposes other than remedying health state, unspecified: Secondary | ICD-10-CM | POA: Diagnosis not present

## 2023-09-01 DIAGNOSIS — Z419 Encounter for procedure for purposes other than remedying health state, unspecified: Secondary | ICD-10-CM | POA: Diagnosis not present

## 2023-09-21 ENCOUNTER — Encounter: Payer: Self-pay | Admitting: Nurse Practitioner

## 2023-09-21 ENCOUNTER — Ambulatory Visit: Admitting: Nurse Practitioner

## 2023-09-21 VITALS — BP 111/73 | HR 80 | Ht 63.0 in | Wt 146.0 lb

## 2023-09-21 DIAGNOSIS — Z113 Encounter for screening for infections with a predominantly sexual mode of transmission: Secondary | ICD-10-CM

## 2023-09-21 DIAGNOSIS — Z3202 Encounter for pregnancy test, result negative: Secondary | ICD-10-CM | POA: Diagnosis not present

## 2023-09-21 DIAGNOSIS — Z01419 Encounter for gynecological examination (general) (routine) without abnormal findings: Secondary | ICD-10-CM

## 2023-09-21 DIAGNOSIS — Z309 Encounter for contraceptive management, unspecified: Secondary | ICD-10-CM | POA: Diagnosis not present

## 2023-09-21 DIAGNOSIS — N730 Acute parametritis and pelvic cellulitis: Secondary | ICD-10-CM

## 2023-09-21 DIAGNOSIS — Z975 Presence of (intrauterine) contraceptive device: Secondary | ICD-10-CM

## 2023-09-21 DIAGNOSIS — Z3009 Encounter for other general counseling and advice on contraception: Secondary | ICD-10-CM

## 2023-09-21 LAB — HEMOGLOBIN, FINGERSTICK: Hemoglobin: 14.2 g/dL (ref 11.1–15.9)

## 2023-09-21 LAB — HM HIV SCREENING LAB: HM HIV Screening: NEGATIVE

## 2023-09-21 LAB — WET PREP FOR TRICH, YEAST, CLUE
Trichomonas Exam: NEGATIVE
Yeast Exam: NEGATIVE

## 2023-09-21 LAB — PREGNANCY, URINE: Preg Test, Ur: NEGATIVE

## 2023-09-21 MED ORDER — DOXYCYCLINE HYCLATE 100 MG PO TABS: 100.0000 mg | ORAL_TABLET | Freq: Two times a day (BID) | ORAL | Status: AC

## 2023-09-21 MED ORDER — CEFTRIAXONE SODIUM 500 MG IJ SOLR
500.0000 mg | Freq: Once | INTRAMUSCULAR | Status: AC
  Administered 2023-09-21: 500 mg via INTRAMUSCULAR

## 2023-09-21 MED ORDER — PARAGARD INTRAUTERINE COPPER IU IUD: 1.0000 | INTRAUTERINE_SYSTEM | Freq: Once | INTRAUTERINE | Status: DC

## 2023-09-21 MED ORDER — METRONIDAZOLE 500 MG PO TABS: 500.0000 mg | ORAL_TABLET | Freq: Two times a day (BID) | ORAL | Status: AC

## 2023-09-21 NOTE — Progress Notes (Signed)
 Pt is here for family planning visit.  Family planning packet reviewed and given to pt.  Wet prep results reviewed, no treatment required per standing orders. Condoms given. Treated for PID as ordered. Rocephin  given and tolerated well.  Contact card given. The patient was dispensed metronidazole #28 and doxycycline#28  today. I provided counseling today regarding the medication. We discussed the medication, the side effects and when to call clinic. Patient given the opportunity to ask questions. Questions answered.  Caren Channel, RN

## 2023-09-21 NOTE — Progress Notes (Unsigned)
  [facility] DEPARTMENT [facility] [address], [address] [place] [address] Main phone: [phone]  Family Planning Visit  Pt saw J. Idol, NP for the majority of her visit. She desired a copper IUD and this provider saw her for the physical portion of her visit. For all other details please see J. Idol's note.   Objective:   Vitals:   09/21/23 1039  BP: 111/73  Pulse: 80  Weight: 146 lb (66.2 kg)  Height: 5\' 3"  (1.6 m)    Physical Exam Vitals and nursing note reviewed. Exam conducted with a chaperone present Verdis Glade, Charity fundraiser).  Constitutional:      Appearance: Normal appearance.  HENT:     Head: Normocephalic and atraumatic.     Mouth/Throat:     Mouth: Mucous membranes are moist.     Pharynx: Oropharynx is clear. No oropharyngeal exudate or posterior oropharyngeal erythema.  Pulmonary:     Effort: Pulmonary effort is normal.  Abdominal:     General: Abdomen is flat.     Palpations: There is no mass.     Tenderness: There is abdominal tenderness in the right lower quadrant and left lower quadrant. There is no rebound.  Genitourinary:    General: Normal vulva.     Exam position: Lithotomy position.     Pubic Area: No rash or pubic lice.      Labia:        Right: No rash or lesion.        Left: No rash or lesion.      Vagina: Vaginal discharge (moderate amount thick white) present. No erythema, bleeding or lesions.     Cervix: Discharge (white mucoid) and erythema present. No cervical motion tenderness, friability or lesion.     Uterus: Normal. Not tender.      Adnexa:        Right: Tenderness present.        Left: Tenderness present.      Rectum: Normal.     Comments: pH = less than 4 Lymphadenopathy:     Head:     Right side of head: No preauricular or posterior auricular adenopathy.     Left side of head: No preauricular or posterior auricular adenopathy.     Cervical: No cervical adenopathy.     Upper Body:     Right upper  body: No supraclavicular, axillary or epitrochlear adenopathy.     Left upper body: No supraclavicular, axillary or epitrochlear adenopathy.     Lower Body: No right inguinal adenopathy. No left inguinal adenopathy.  Skin:    General: Skin is warm and dry.     Findings: No rash.  Neurological:     Mental Status: She is alert and oriented to person, place, and time.  Psychiatric:        Mood and Affect: Mood normal.        Behavior: Behavior normal.    IUD not inserted given exam findings and subjective reports of dyspareunia. For the rest of the visit information please see J. Idol's note.  Kadra Kohan K Ottavio Norem, NP

## 2023-09-22 ENCOUNTER — Encounter: Payer: Self-pay | Admitting: Nurse Practitioner

## 2023-09-22 NOTE — Progress Notes (Signed)
 [facility] DEPARTMENT [facility] [address] [place] [address] Main phone: [phone]  Family Planning Visit - Initial Visit  Subjective:  Susan Hoover is a 23 y.o.  [id number]   being seen today for an initial annual visit and to discuss reproductive life planning.  The patient is currently using female condom for pregnancy prevention. Patient does not want a pregnancy in the next year.   Patient reports they are looking for a method with the following characteristics:  High efficacy at preventing pregnancy Discrete method Long term method Method that does not involve too much memory  Patient has the following medical conditions: Patient Active Problem List   Diagnosis Date Noted   Amenorrhea 04/03/2023   Post-dates pregnancy 12/04/2020   Irregular uterine contractions 12/02/2020   Insufficient prenatal care in third trimester 12/01/2020   Indication for care in labor or delivery 11/21/2020   Recurrent urinary tract infection affecting pregnancy in third trimester 10/07/2020   History of pyelonephritis during pregnancy 06/18/2020   Post-term pregnancy, 40-42 weeks of gestation 12/29/2017   Anemia complicating pregnancy, third trimester 11/21/2017   Pyelonephritis affecting pregnancy in second trimester 08/09/2017   Abdominal pain 08/07/2017    Chief Complaint  Patient presents with   Annual Exam    Paragard insert    HPI Patient is a pleasant 23 y.o. female who presents to the office today for annual well woman exam, PAP, IUD placement and is requesting symptomatic STI testing.  Patient reports concerns today include the following: Easily bruising: Pt reports noticing bruising across body in multiple stages of healing at various times. She denies petechial bruising. Patient reports being told she was anemic by [facility] about 8 months ago. Patient unable to find hgb results.  Blurry vision:  Reports this is worst at night while she is driving and when her eyes are tired. States she has difficulty seeing at a distance. Reports an eye exam 2 years ago and was prescribed eye glasses. She states they help but have expired.  Vaginal discharge and Painful sex: Reports pain with sex in her lower abdomin that began about 2 weeks ago. Pt noted dark brown vaginal discharge the morning following painful sex 1 week ago. Has not had this before. No additional symptoms noted.    Patient indicates 1 female partner in the last 2 months. She reports practicing vaginal sex and uses condoms  since OCP has run out except for one time yesterday. Last OCP 08/30/23. Period started 08/31/23. Condom use since last pill except for once yesterday. Relying on paragard IUD insertion today for ECP.  Patient indicates no STI history.    Review of Systems  Constitutional:  Negative for weight loss.  HENT:  Negative for sore throat.   Eyes:  Positive for blurred vision.  Respiratory:  Negative for cough, hemoptysis, shortness of breath and wheezing.   Cardiovascular:  Negative for chest pain and claudication.  Gastrointestinal:  Negative for nausea and vomiting.  Genitourinary:  Negative for dysuria and frequency. Urgency: .jino.      Positive for dyspareunia.  Skin:  Negative for rash.  Neurological:  Negative for dizziness, seizures and headaches.  Endo/Heme/Allergies:  Bruises/bleeds easily.   Diabetes screening This patient is 23 y.o. with a BMI of Body mass index is 25.86 kg/m.Aaron Aas  Is patient eligible for diabetes screening (age >35 and BMI >25)?  no  Was Hgb A1c ordered? not applicable  STI screening Patient  reports 1 of partners in last year.  Does this patient desire STI screening?  Yes  Hepatitis C screening Has patient been screened once for HCV in the past?  No  No results found for: "HCVAB"  Does the patient meet criteria for HCV testing? No  (If yes-- Screen for HCV through [facility]  Lab) Criteria:  Since the last HCV result, does the patient have any of the following? - Current drug use - Have a partner with drug use - Has been incarcerated  Hepatitis B screening Does the patient meet criteria for HBV testing? No Criteria:  -Household, sexual or needle sharing contact with HBV -History of drug use -HIV positive -Those with known Hep C  Cervical Cancer Screening  No Cervical Cancer Screening results to display.  Health Maintenance Due  Topic Date Due   HPV VACCINES (1 - 3-dose series) Never done   Meningococcal B Vaccine (1 of 2 - Standard) Never done   Hepatitis C Screening  Never done   Cervical Cancer Screening (Pap smear)  Never done   CHLAMYDIA SCREENING  05/06/2021   COVID-19 Vaccine (1 - 2024-25 season) Never done    The following portions of the patient's history were reviewed and updated as appropriate: allergies, current medications, past family history, past medical history, past social history, past surgical history and problem list. Problem list updated.  See flowsheet for other program required questions.  Objective:   Vitals:   09/21/23 1039  BP: 111/73  Pulse: 80  Weight: 146 lb (66.2 kg)  Height: 5\' 3"  (1.6 m)    Physical Exam This provider performed patient interview only. Philip Bravo, [facility] performed PE and collected STI tests and PAP. This is because patient wanted IUD and this provider Leary Provencal L. Tiffiny Worthy, FNP-C) is not approved to insert IUDs independently; however, Philip Bravo, WHNP is approved to practice independently.   Assessment and Plan:  Susan Hoover is a 23 y.o. female presenting to the [facility] Department for an initial annual wellness/contraceptive visit  Contraception counseling: Reviewed options based on patient desire and reproductive life plan. Patient is interested in IUD or IUS. This was not provided to the patient today. Per Philip Bravo, [facility], upon PE patient is not a  candidate for IUD today. Please see Philip Bravo, [facility] note for additional information.   Risks, benefits, and typical effectiveness rates were reviewed.  Questions were answered.  Written information was also given to the patient to review.    The patient will follow up in  1 years for surveillance.  The patient was told to call with any further questions, or with any concerns about this method of contraception.  Emphasized use of condoms 100% of the time for STI prevention. Educated on ECP and assessed need for ECP. Patient does not qualify for ECP based on no unprotected sex.   1. Family planning (Primary) Obtained UPT prior to patient interview and exam on the premise patient was to have IUD inserted today.  UPT negative in office today.  - Pregnancy, urine  2. Well woman exam CBE not due per ACOG guidelines.  PAP collected.  Patient has PCP. Encouraged to follow up for annual wellness visits and to have eye exam performed with referral to eye specialists if indicated. Advised patient she could still use prescription glasses if they are helping and not causing adverse effects. Patient verbalized understanding and agreed with plan.  Obtained hgb today due to patient self-report of anemia with  easy bruising. - Hemoglobin, fingerstick - IGP, rfx Aptima HPV ASCU  3. Screening for venereal disease  - HIV [place] [facility] - Syphilis Serology, [facility] - Chlamydia/Gonorrhea [place] [facility] - WET PREP FOR TRICH, YEAST, CLUE  4. PID (acute pelvic inflammatory disease) Diagnosed by Philip Bravo, [facility] based on PE she performed. Please see separate note authored by Philip Bravo, [facility] for details.   - doxycycline (VIBRA-TABS) 100 MG tablet; Take 1 tablet (100 mg total) by mouth 2 (two) times daily for 14 days. - metroNIDAZOLE  (FLAGYL ) 500 MG tablet; Take 1 tablet (500 mg total) by mouth 2 (two) times daily for 14 days. - cefTRIAXone  (ROCEPHIN ) injection 500 mg  Return for upon  completion of treatment for IUD insertion.  No future appointments.  Merleen Stare, NP

## 2023-09-24 ENCOUNTER — Other Ambulatory Visit: Payer: Self-pay | Admitting: Nurse Practitioner

## 2023-09-24 DIAGNOSIS — Z3009 Encounter for other general counseling and advice on contraception: Secondary | ICD-10-CM

## 2023-09-24 MED ORDER — LEVONORGESTREL 1.5 MG PO TABS: 1.5000 mg | ORAL_TABLET | Freq: Once | ORAL | 0 refills | Status: AC

## 2023-09-24 NOTE — Progress Notes (Signed)
 Called pt to advise taking ECP based on unprotected sex on 5/1 and unable to place copper IUD on 5/2. Sent rx for Plan B.  Explained how to take the pill and possible side effects. Asked pt to take pregnancy test in 2 weeks. Re-emphasized why pt received antibiotics on 5/2 visit, PID. Pt verbalized understanding.  Marlisa Caridi K Luther Newhouse, NP

## 2023-09-27 LAB — IGP, RFX APTIMA HPV ASCU: PAP Smear Comment: 0

## 2023-09-28 ENCOUNTER — Encounter: Payer: Self-pay | Admitting: Family Medicine

## 2023-10-01 DIAGNOSIS — Z419 Encounter for procedure for purposes other than remedying health state, unspecified: Secondary | ICD-10-CM | POA: Diagnosis not present

## 2023-10-03 ENCOUNTER — Encounter: Payer: Self-pay | Admitting: Nurse Practitioner

## 2023-10-29 ENCOUNTER — Telehealth: Payer: Self-pay | Admitting: Family Medicine

## 2023-11-01 DIAGNOSIS — Z419 Encounter for procedure for purposes other than remedying health state, unspecified: Secondary | ICD-10-CM | POA: Diagnosis not present

## 2023-12-01 DIAGNOSIS — Z419 Encounter for procedure for purposes other than remedying health state, unspecified: Secondary | ICD-10-CM | POA: Diagnosis not present

## 2024-01-01 DIAGNOSIS — Z419 Encounter for procedure for purposes other than remedying health state, unspecified: Secondary | ICD-10-CM | POA: Diagnosis not present

## 2024-02-01 DIAGNOSIS — Z419 Encounter for procedure for purposes other than remedying health state, unspecified: Secondary | ICD-10-CM | POA: Diagnosis not present

## 2024-02-20 ENCOUNTER — Ambulatory Visit

## 2024-03-02 DIAGNOSIS — Z419 Encounter for procedure for purposes other than remedying health state, unspecified: Secondary | ICD-10-CM | POA: Diagnosis not present

## 2024-04-02 DIAGNOSIS — Z419 Encounter for procedure for purposes other than remedying health state, unspecified: Secondary | ICD-10-CM | POA: Diagnosis not present

## 2024-05-02 DIAGNOSIS — Z419 Encounter for procedure for purposes other than remedying health state, unspecified: Secondary | ICD-10-CM | POA: Diagnosis not present

## 2024-07-01 ENCOUNTER — Ambulatory Visit
